# Patient Record
Sex: Female | Born: 1965 | Race: White | Hispanic: No | Marital: Married | Smoking: Current every day smoker
Health system: Southern US, Community
[De-identification: ages and names within clinical notes are randomized; demographics above are authoritative.]

## PROBLEM LIST (undated history)

## (undated) DIAGNOSIS — M542 Cervicalgia: Secondary | ICD-10-CM

## (undated) DIAGNOSIS — J449 Chronic obstructive pulmonary disease, unspecified: Secondary | ICD-10-CM

## (undated) DIAGNOSIS — G8929 Other chronic pain: Secondary | ICD-10-CM

## (undated) DIAGNOSIS — M719 Bursopathy, unspecified: Secondary | ICD-10-CM

## (undated) DIAGNOSIS — Z87442 Personal history of urinary calculi: Secondary | ICD-10-CM

## (undated) DIAGNOSIS — M549 Dorsalgia, unspecified: Secondary | ICD-10-CM

## (undated) DIAGNOSIS — M797 Fibromyalgia: Secondary | ICD-10-CM

## (undated) DIAGNOSIS — I10 Essential (primary) hypertension: Secondary | ICD-10-CM

## (undated) DIAGNOSIS — E119 Type 2 diabetes mellitus without complications: Secondary | ICD-10-CM

## (undated) HISTORY — PX: BREAST SURGERY: SHX581

## (undated) HISTORY — PX: BREAST BIOPSY: SHX20

---

## 2005-05-05 ENCOUNTER — Emergency Department (HOSPITAL_COMMUNITY): Admission: EM | Admit: 2005-05-05 | Discharge: 2005-05-06 | Payer: Self-pay | Admitting: Emergency Medicine

## 2005-05-18 ENCOUNTER — Emergency Department (HOSPITAL_COMMUNITY): Admission: EM | Admit: 2005-05-18 | Discharge: 2005-05-18 | Payer: Self-pay | Admitting: *Deleted

## 2006-01-29 ENCOUNTER — Emergency Department (HOSPITAL_COMMUNITY): Admission: EM | Admit: 2006-01-29 | Discharge: 2006-01-29 | Payer: Self-pay | Admitting: Emergency Medicine

## 2006-01-31 ENCOUNTER — Emergency Department (HOSPITAL_COMMUNITY): Admission: EM | Admit: 2006-01-31 | Discharge: 2006-01-31 | Payer: Self-pay | Admitting: Emergency Medicine

## 2006-03-19 ENCOUNTER — Ambulatory Visit: Payer: Self-pay | Admitting: Internal Medicine

## 2006-03-19 LAB — CONVERTED CEMR LAB
Sed Rate: 32 mm/hr — ABNORMAL HIGH (ref 0–25)
Vit D, 1,25-Dihydroxy: 38 (ref 20–57)

## 2006-08-27 ENCOUNTER — Ambulatory Visit: Payer: Self-pay | Admitting: Vascular Surgery

## 2006-08-27 ENCOUNTER — Emergency Department (HOSPITAL_COMMUNITY): Admission: EM | Admit: 2006-08-27 | Discharge: 2006-08-27 | Payer: Self-pay | Admitting: Emergency Medicine

## 2007-10-21 ENCOUNTER — Emergency Department (HOSPITAL_COMMUNITY): Admission: EM | Admit: 2007-10-21 | Discharge: 2007-10-21 | Payer: Self-pay | Admitting: Family Medicine

## 2008-05-05 ENCOUNTER — Emergency Department (HOSPITAL_COMMUNITY): Admission: EM | Admit: 2008-05-05 | Discharge: 2008-05-05 | Payer: Self-pay | Admitting: Emergency Medicine

## 2008-05-11 ENCOUNTER — Emergency Department (HOSPITAL_COMMUNITY): Admission: EM | Admit: 2008-05-11 | Discharge: 2008-05-11 | Payer: Self-pay | Admitting: Emergency Medicine

## 2008-05-20 ENCOUNTER — Emergency Department (HOSPITAL_COMMUNITY): Admission: EM | Admit: 2008-05-20 | Discharge: 2008-05-20 | Payer: Self-pay | Admitting: Emergency Medicine

## 2008-06-24 ENCOUNTER — Emergency Department (HOSPITAL_COMMUNITY): Admission: EM | Admit: 2008-06-24 | Discharge: 2008-06-24 | Payer: Self-pay | Admitting: Emergency Medicine

## 2008-09-21 ENCOUNTER — Emergency Department (HOSPITAL_COMMUNITY): Admission: EM | Admit: 2008-09-21 | Discharge: 2008-09-21 | Payer: Self-pay | Admitting: Emergency Medicine

## 2009-01-18 ENCOUNTER — Emergency Department (HOSPITAL_COMMUNITY): Admission: EM | Admit: 2009-01-18 | Discharge: 2009-01-18 | Payer: Self-pay | Admitting: Emergency Medicine

## 2009-05-30 ENCOUNTER — Emergency Department (HOSPITAL_COMMUNITY): Admission: EM | Admit: 2009-05-30 | Discharge: 2009-05-30 | Payer: Self-pay | Admitting: Emergency Medicine

## 2010-01-13 ENCOUNTER — Emergency Department (HOSPITAL_COMMUNITY)
Admission: EM | Admit: 2010-01-13 | Discharge: 2010-01-13 | Payer: Self-pay | Source: Home / Self Care | Admitting: Emergency Medicine

## 2010-04-23 LAB — URINE MICROSCOPIC-ADD ON

## 2010-04-23 LAB — URINALYSIS, ROUTINE W REFLEX MICROSCOPIC
Glucose, UA: NEGATIVE mg/dL
Leukocytes, UA: NEGATIVE
Nitrite: NEGATIVE
pH: 7 (ref 5.0–8.0)

## 2010-06-12 ENCOUNTER — Emergency Department (HOSPITAL_COMMUNITY)
Admission: EM | Admit: 2010-06-12 | Discharge: 2010-06-12 | Discharge status: Against Medical Advice | Payer: Self-pay | Attending: Emergency Medicine | Admitting: Emergency Medicine

## 2010-06-12 DIAGNOSIS — IMO0001 Reserved for inherently not codable concepts without codable children: Secondary | ICD-10-CM | POA: Insufficient documentation

## 2010-11-05 LAB — DIFFERENTIAL
Basophils Absolute: 0
Basophils Relative: 0
Eosinophils Absolute: 0.2
Eosinophils Relative: 3
Lymphocytes Relative: 43
Lymphs Abs: 3.5 — ABNORMAL HIGH
Monocytes Absolute: 0.6
Monocytes Relative: 7
Neutro Abs: 3.9
Neutrophils Relative %: 48

## 2010-11-05 LAB — BASIC METABOLIC PANEL
BUN: 8
CO2: 26
Calcium: 8.6
Chloride: 102
Creatinine, Ser: 0.66
GFR calc Af Amer: >60
GFR calc non Af Amer: >60
Glucose, Bld: 95
Potassium: 3.7
Sodium: 135

## 2010-11-05 LAB — URINALYSIS, ROUTINE W REFLEX MICROSCOPIC
Bilirubin Urine: NEGATIVE
Glucose, UA: NEGATIVE
Hgb urine dipstick: NEGATIVE
Ketones, ur: NEGATIVE
Nitrite: NEGATIVE
Protein, ur: NEGATIVE
Specific Gravity, Urine: 1.016
Urobilinogen, UA: 0.2
pH: 7

## 2010-11-05 LAB — CBC
HCT: 34.8 — ABNORMAL LOW
Hemoglobin: 12.2
MCHC: 34.9
MCV: 87.9
Platelets: 232
RBC: 3.96
RDW: 13.4
WBC: 8.2

## 2011-08-17 ENCOUNTER — Encounter (HOSPITAL_COMMUNITY): Payer: Self-pay | Admitting: Emergency Medicine

## 2011-08-17 ENCOUNTER — Emergency Department (HOSPITAL_COMMUNITY)
Admission: EM | Admit: 2011-08-17 | Discharge: 2011-08-17 | Disposition: A | Discharge status: Home / Self Care | Payer: Self-pay | Attending: Emergency Medicine | Admitting: Emergency Medicine

## 2011-08-17 DIAGNOSIS — M549 Dorsalgia, unspecified: Secondary | ICD-10-CM | POA: Insufficient documentation

## 2011-08-17 DIAGNOSIS — M542 Cervicalgia: Secondary | ICD-10-CM | POA: Insufficient documentation

## 2011-08-17 DIAGNOSIS — IMO0001 Reserved for inherently not codable concepts without codable children: Secondary | ICD-10-CM | POA: Insufficient documentation

## 2011-08-17 DIAGNOSIS — Z760 Encounter for issue of repeat prescription: Secondary | ICD-10-CM

## 2011-08-17 DIAGNOSIS — I10 Essential (primary) hypertension: Secondary | ICD-10-CM | POA: Insufficient documentation

## 2011-08-17 DIAGNOSIS — G8929 Other chronic pain: Secondary | ICD-10-CM | POA: Insufficient documentation

## 2011-08-17 DIAGNOSIS — Z79899 Other long term (current) drug therapy: Secondary | ICD-10-CM | POA: Insufficient documentation

## 2011-08-17 HISTORY — DX: Essential (primary) hypertension: I10

## 2011-08-17 HISTORY — DX: Fibromyalgia: M79.7

## 2011-08-17 MED ORDER — HYDROCODONE-ACETAMINOPHEN 10-500 MG PO TABS: 1.0000 | ORAL_TABLET | Freq: Four times a day (QID) | ORAL | Status: AC | PRN

## 2011-08-17 NOTE — ED Notes (Signed)
Pt states having pain all over, stiffness in muscles and joints much worse the last 3 days. Lower and cervical neck pain also worse last 3 days. Lost her job so she can't go see her regular doctor, out of her meds she usually takes for 1 week.

## 2011-08-17 NOTE — ED Provider Notes (Addendum)
History    [name] with pain "everywhere." Chronic. No acute change. Attributes to fibromyalgia. Out of her pain meds. Unemployed and can't afford co-pay for PCP so came to ED. Denies acute trauma. No fever or chills. No n/v. No SOB. No dizziness or lightheadedness.  CSN: [id number]  Arrival date & time 08/17/11  1108   First MD Initiated Contact with Patient 08/17/11 1137      Chief Complaint  Patient presents with  . Fibromyalgia  . Pain    chronic back and neck pain    (Consider location/radiation/quality/duration/timing/severity/associated sxs/prior treatment) HPI  Past Medical History  Diagnosis Date  . Hypertension   . Fibromyalgia     Past Surgical History  Procedure Date  . Breast surgery 1996    left lumpectomy    No family history on file.  History  Substance Use Topics  . Smoking status: Current Everyday Smoker -- 0.7 packs/day  . Smokeless tobacco: Never Used  . Alcohol Use: No    OB History    Grav Para Term Preterm Abortions TAB SAB Ect Mult Living                  Review of Systems   Review of symptoms negative unless otherwise noted in HPI.  Allergies  Penicillins  Home Medications   Current Outpatient Rx  Name Route Sig Dispense Refill  . HYDROCODONE-ACETAMINOPHEN 10-500 MG PO TABS Oral Take 1 tablet by mouth every 6 (six) hours as needed. For pain    . LISINOPRIL 20 MG PO TABS Oral Take 20 mg by mouth daily.      BP 108/72  Pulse 79  Temp 98.1 F (36.7 C) (Oral)  SpO2 98%  LMP 07/28/2011  Physical Exam  Nursing note and vitals reviewed. Constitutional: She is oriented to person, place, and time. She appears well-developed and well-nourished. No distress.       Sitting in chair. NAD. Obese.  HENT:  Head: Normocephalic and atraumatic.  Eyes: Conjunctivae are normal. Right eye exhibits no discharge. Left eye exhibits no discharge.  Neck: Neck supple.  Cardiovascular: Normal rate, regular rhythm and normal heart sounds.  Exam  reveals no gallop and no friction rub.   No murmur heard. Pulmonary/Chest: Effort normal and breath sounds normal. No respiratory distress.  Abdominal: Soft. She exhibits no distension. There is no tenderness.  Musculoskeletal: She exhibits no edema and no tenderness.  Neurological: She is alert and oriented to person, place, and time.  Skin: Skin is warm and dry.  Psychiatric: She has a normal mood and affect. Her behavior is normal. Thought content normal.    ED Course  Procedures (including critical care time)  Labs Reviewed - No data to display No results found.   1. Chronic pain   2. Encounter for medication refill       MDM  [name] with chronic pain which she attributes to fibromylagia. No acute change but out of pain meds. No recent ED visits. Script provided but discussed chronic pain management and med refills through ED.  Encouraged to quit smoking.        Raeford Razor, MD 08/20/11 1120  Raeford Razor, MD 08/20/11 1120

## 2011-10-14 ENCOUNTER — Emergency Department (HOSPITAL_COMMUNITY): Payer: Self-pay

## 2011-10-14 ENCOUNTER — Emergency Department (HOSPITAL_COMMUNITY)
Admission: EM | Admit: 2011-10-14 | Discharge: 2011-10-14 | Disposition: A | Discharge status: Home / Self Care | Payer: Self-pay | Attending: Emergency Medicine | Admitting: Emergency Medicine

## 2011-10-14 ENCOUNTER — Encounter (HOSPITAL_COMMUNITY): Payer: Self-pay

## 2011-10-14 DIAGNOSIS — Z880 Allergy status to penicillin: Secondary | ICD-10-CM | POA: Insufficient documentation

## 2011-10-14 DIAGNOSIS — I10 Essential (primary) hypertension: Secondary | ICD-10-CM | POA: Insufficient documentation

## 2011-10-14 DIAGNOSIS — F17200 Nicotine dependence, unspecified, uncomplicated: Secondary | ICD-10-CM | POA: Insufficient documentation

## 2011-10-14 DIAGNOSIS — R062 Wheezing: Secondary | ICD-10-CM | POA: Insufficient documentation

## 2011-10-14 DIAGNOSIS — G8929 Other chronic pain: Secondary | ICD-10-CM | POA: Insufficient documentation

## 2011-10-14 DIAGNOSIS — M549 Dorsalgia, unspecified: Secondary | ICD-10-CM | POA: Insufficient documentation

## 2011-10-14 HISTORY — DX: Dorsalgia, unspecified: M54.9

## 2011-10-14 HISTORY — DX: Other chronic pain: G89.29

## 2011-10-14 HISTORY — DX: Cervicalgia: M54.2

## 2011-10-14 MED ORDER — ALBUTEROL SULFATE (5 MG/ML) 0.5% IN NEBU
5.0000 mg | INHALATION_SOLUTION | Freq: Once | RESPIRATORY_TRACT | Status: AC
  Administered 2011-10-14: 5 mg via RESPIRATORY_TRACT
  Filled 2011-10-14: qty 1

## 2011-10-14 MED ORDER — PREDNISONE 10 MG PO TABS: 20.0000 mg | ORAL_TABLET | Freq: Every day | ORAL | Status: DC

## 2011-10-14 MED ORDER — PREDNISONE 20 MG PO TABS
60.0000 mg | ORAL_TABLET | Freq: Once | ORAL | Status: AC
  Administered 2011-10-14: 60 mg via ORAL
  Filled 2011-10-14: qty 3

## 2011-10-14 MED ORDER — CYCLOBENZAPRINE HCL 10 MG PO TABS: 10.0000 mg | ORAL_TABLET | Freq: Two times a day (BID) | ORAL | Status: DC | PRN

## 2011-10-14 MED ORDER — ALBUTEROL SULFATE HFA 108 (90 BASE) MCG/ACT IN AERS
2.0000 | INHALATION_SPRAY | RESPIRATORY_TRACT | Status: DC | PRN
  Administered 2011-10-14: 2 via RESPIRATORY_TRACT
  Filled 2011-10-14: qty 6.7

## 2011-10-14 NOTE — ED Notes (Signed)
Pt c/o sob and chronic back pain. SOB started this morning with little cough, sat 96%, in no distress. Back pain x couple days 8/10, haven't been taking pain medicines.

## 2011-10-14 NOTE — ED Provider Notes (Signed)
History     CSN: [id number]  Arrival date & time 10/14/11  1614   First MD Initiated Contact with Patient 10/14/11 2025      Chief Complaint  Patient presents with  . Shortness of Breath  . Back Pain    (Consider location/radiation/quality/duration/timing/severity/associated sxs/prior treatment) HPI Patient here with back pain today.  Patient with history of chronic back pain and now without pmd as lost insurance and no pmd for three months.  Patient with sob since this a.m. On awakening.  Sob present on awakening, no cough, no fever, some wheezing, patient is smoker.  Ibuprofen for back pain, patient was on hydrocodone three months ago.  States had some hydrocodone two months ago for fibromyalgia.  NO dvt or pe, no trips, or immobilization,  Past Medical History  Diagnosis Date  . Hypertension   . Fibromyalgia   . Chronic back pain   . Chronic neck pain     Past Surgical History  Procedure Date  . Breast surgery     No family history on file.  History  Substance Use Topics  . Smoking status: Current Every Day Smoker -- 0.7 packs/day  . Smokeless tobacco: Never Used  . Alcohol Use: No    OB History    Grav Para Term Preterm Abortions TAB SAB Ect Mult Living                  Review of Systems  Allergies  Penicillins  Home Medications   Current Outpatient Rx  Name Route Sig Dispense Refill  . HYDROCODONE-ACETAMINOPHEN 10-500 MG PO TABS Oral Take 1 tablet by mouth every 6 (six) hours as needed. For pain    . IBUPROFEN 200 MG PO TABS Oral Take 800 mg by mouth 2 (two) times daily as needed. For pain.    Marland Kitchen LISINOPRIL 20 MG PO TABS Oral Take 20 mg by mouth daily.      BP 142/93  Pulse 101  Temp 97.9 F (36.6 C) (Oral)  Resp 16  SpO2 96%  LMP 10/07/2011  Physical Exam  Nursing note and vitals reviewed. Constitutional: She is oriented to person, place, and time. She appears well-developed and well-nourished.  HENT:  Head: Normocephalic and atraumatic.    Right Ear: External ear normal.  Left Ear: External ear normal.  Mouth/Throat: Oropharynx is clear and moist.  Eyes: Conjunctivae normal are normal. Pupils are equal, round, and reactive to light.  Neck: Normal range of motion. Neck supple.  Cardiovascular: Normal rate, regular rhythm and normal heart sounds.   Pulmonary/Chest: Effort normal. She has wheezes.       Expiratory wheezes  Abdominal: Soft. Bowel sounds are normal.  Musculoskeletal: Normal range of motion.  Neurological: She is alert and oriented to person, place, and time. She has normal strength and normal reflexes. A sensory deficit is present. She displays a negative Romberg sign. GCS eye subscore is 4. GCS verbal subscore is 5. GCS motor subscore is 6.    ED Course  Procedures (including critical care time)  Labs Reviewed - No data to display Dg Chest 2 View  10/14/2011  *RADIOLOGY REPORT*  Clinical Data: 46 year old female shortness of breath and back pain.  CHEST - 2 VIEW  Comparison: 05/30/2009.  Findings: Slightly larger lung volumes.  Cardiac size and mediastinal contours are within normal limits.  Visualized tracheal air column is within normal limits.  Chronic scarring in the left midlung is stable.  No pneumothorax, pleural effusion, pulmonary edema or acute  pulmonary opacity. Stable visualized osseous structures.  IMPRESSION: No acute cardiopulmonary abnormality.   Original Report Authenticated By: Harley Hallmark, M.D.      No diagnosis found.    MDM  Patient with some expiratory wheezes but no respiratory distress.  Sats normal.  Patient counseled regarding pain treatment and smoking.  Plan mdi and prednisone. Patient to be treated with flexeril and will return to nsaids after prednisone.         Hilario Quarry, MD 10/14/11 [phone]

## 2011-11-05 ENCOUNTER — Emergency Department (INDEPENDENT_AMBULATORY_CARE_PROVIDER_SITE_OTHER)
Admission: EM | Admit: 2011-11-05 | Discharge: 2011-11-05 | Disposition: A | Discharge status: Home / Self Care | Payer: Self-pay | Source: Home / Self Care | Attending: Emergency Medicine | Admitting: Emergency Medicine

## 2011-11-05 ENCOUNTER — Encounter (HOSPITAL_COMMUNITY): Payer: Self-pay | Admitting: *Deleted

## 2011-11-05 DIAGNOSIS — G8929 Other chronic pain: Secondary | ICD-10-CM

## 2011-11-05 DIAGNOSIS — M797 Fibromyalgia: Secondary | ICD-10-CM

## 2011-11-05 DIAGNOSIS — IMO0001 Reserved for inherently not codable concepts without codable children: Secondary | ICD-10-CM

## 2011-11-05 MED ORDER — TRAMADOL HCL 50 MG PO TABS: 50.0000 mg | ORAL_TABLET | Freq: Four times a day (QID) | ORAL | Status: DC | PRN

## 2011-11-05 MED ORDER — KETOROLAC TROMETHAMINE 60 MG/2ML IM SOLN
60.0000 mg | Freq: Once | INTRAMUSCULAR | Status: AC
  Administered 2011-11-05: 60 mg via INTRAMUSCULAR

## 2011-11-05 MED ORDER — KETOROLAC TROMETHAMINE 60 MG/2ML IM SOLN
INTRAMUSCULAR | Status: AC
  Filled 2011-11-05: qty 2

## 2011-11-05 NOTE — ED Notes (Signed)
C/o pain all over from Fibromyalgia. States it flared up 2 days ago.

## 2011-11-05 NOTE — ED Provider Notes (Signed)
Medical screening examination/treatment/procedure(s) were performed by a resident physician and as supervising physician I was immediately available for consultation/collaboration.  Additionally, I saw the patient independently, verified the history, examined the patient and discussed the treatment plan with the resident. The patient presents tonight requesting refills and hydrocodone. We explained to her that we did not treat chronic pain with opioid analgesics. She was given a few tramadols to tide her over until she can find a primary care physician as well as an injection of Toradol.  Leslee Home, M.D.    Reuben Likes, MD 11/05/11 2216

## 2011-11-05 NOTE — ED Provider Notes (Signed)
History     CSN: [id number]  Arrival date & time 11/05/11  1709   First MD Initiated Contact with Patient 11/05/11 1801      No chief complaint on file.   (Consider location/radiation/quality/duration/timing/severity/associated sxs/prior treatment) HPI pt with long history of fibromyalgia reportedly managed with Lortab.  Reports she lost job and lost insurance 4 months ago and since then has not been able to afford to go to the doctor.  She has had increasing amounts of generalized body pains that are typical of her fibromyalgia and has run out of her pain medications.  She has been taking large quantities of NSAIDs, which are only mildly helpful, and says they are causing her to have an upset stomach.  There is no immediate inciting event.  She admits to some wheezing but no shortness of breath, no skin lesions, no weakness or numbness.  Past Medical History  Diagnosis Date  . Hypertension   . Fibromyalgia   . Chronic back pain   . Chronic neck pain     Past Surgical History  Procedure Date  . Breast surgery     No family history on file.  History  Substance Use Topics  . Smoking status: Current Every Day Smoker -- 0.7 packs/day  . Smokeless tobacco: Never Used  . Alcohol Use: No    OB History    Grav Para Term Preterm Abortions TAB SAB Ect Mult Living                  Review of Systems per HPI.  Allergies  Penicillins  Home Medications   Current Outpatient Rx  Name Route Sig Dispense Refill  . CYCLOBENZAPRINE HCL 10 MG PO TABS Oral Take 1 tablet (10 mg total) by mouth 2 (two) times daily as needed for muscle spasms. 20 tablet 0  . HYDROCODONE-ACETAMINOPHEN 10-500 MG PO TABS Oral Take 1 tablet by mouth every 6 (six) hours as needed. For pain    . IBUPROFEN 200 MG PO TABS Oral Take 800 mg by mouth 2 (two) times daily as needed. For pain.    Marland Kitchen LISINOPRIL 20 MG PO TABS Oral Take 20 mg by mouth daily.    Marland Kitchen PREDNISONE 10 MG PO TABS Oral Take 2 tablets (20 mg  total) by mouth daily. 15 tablet 0  . TRAMADOL HCL 50 MG PO TABS Oral Take 1 tablet (50 mg total) by mouth every 6 (six) hours as needed for pain. 30 tablet 0    BP 146/73  Pulse 75  Temp 98.4 F (36.9 C) (Oral)  Resp 18  SpO2 99%  LMP 10/07/2011  Physical Exam  Constitutional: She is oriented to person, place, and time. She appears well-developed and well-nourished. No distress.  HENT:  Head: Normocephalic and atraumatic.  Right Ear: External ear normal.  Left Ear: External ear normal.  Neck: Normal range of motion.  Cardiovascular: Normal rate, regular rhythm and normal heart sounds.   Pulmonary/Chest: Effort normal.       Occasional scattered wheezes with good air movement  Musculoskeletal: Normal range of motion. She exhibits no edema.  Neurological: She is alert and oriented to person, place, and time.  Psychiatric: She has a normal mood and affect.    ED Course  Procedures (including critical care time)  Labs Reviewed - No data to display No results found.   1. Fibromyalgia   2. Chronic pain       MDM  Chronic pain syndrome.  Gave toradol injection  and rx for #30 tramadol.  Rec seeing [facility] financial assistance for [employer].        Brent Bulla, MD 11/05/11 [phone]

## 2012-02-22 ENCOUNTER — Emergency Department (HOSPITAL_COMMUNITY)
Admission: EM | Admit: 2012-02-22 | Discharge: 2012-02-22 | Disposition: A | Discharge status: Home / Self Care | Payer: Self-pay | Attending: Emergency Medicine | Admitting: Emergency Medicine

## 2012-02-22 ENCOUNTER — Encounter (HOSPITAL_COMMUNITY): Payer: Self-pay | Admitting: Emergency Medicine

## 2012-02-22 DIAGNOSIS — M5410 Radiculopathy, site unspecified: Secondary | ICD-10-CM

## 2012-02-22 DIAGNOSIS — I10 Essential (primary) hypertension: Secondary | ICD-10-CM | POA: Insufficient documentation

## 2012-02-22 DIAGNOSIS — Z791 Long term (current) use of non-steroidal anti-inflammatories (NSAID): Secondary | ICD-10-CM | POA: Insufficient documentation

## 2012-02-22 DIAGNOSIS — IMO0002 Reserved for concepts with insufficient information to code with codable children: Secondary | ICD-10-CM | POA: Insufficient documentation

## 2012-02-22 DIAGNOSIS — F17200 Nicotine dependence, unspecified, uncomplicated: Secondary | ICD-10-CM | POA: Insufficient documentation

## 2012-02-22 DIAGNOSIS — E669 Obesity, unspecified: Secondary | ICD-10-CM | POA: Insufficient documentation

## 2012-02-22 DIAGNOSIS — G8929 Other chronic pain: Secondary | ICD-10-CM | POA: Insufficient documentation

## 2012-02-22 DIAGNOSIS — Z79899 Other long term (current) drug therapy: Secondary | ICD-10-CM | POA: Insufficient documentation

## 2012-02-22 MED ORDER — TRAMADOL HCL 50 MG PO TABS: 50.0000 mg | ORAL_TABLET | Freq: Four times a day (QID) | ORAL | Status: DC | PRN

## 2012-02-22 MED ORDER — TRAMADOL HCL 50 MG PO TABS
50.0000 mg | ORAL_TABLET | Freq: Once | ORAL | Status: AC
  Administered 2012-02-22: 50 mg via ORAL
  Filled 2012-02-22: qty 1

## 2012-02-22 MED ORDER — METHOCARBAMOL 500 MG PO TABS: 500.0000 mg | ORAL_TABLET | Freq: Two times a day (BID) | ORAL | Status: DC

## 2012-02-22 NOTE — ED Notes (Signed)
Pt to room from waiting area

## 2012-02-22 NOTE — ED Notes (Signed)
Pt complains of lower back pain x 1 day

## 2012-02-22 NOTE — ED Provider Notes (Signed)
History     CSN: [id number]  Arrival date & time 02/22/12  1406   None     Chief Complaint  Patient presents with  . Back Pain    (Consider location/radiation/quality/duration/timing/severity/associated sxs/prior treatment) HPI  47 year old female with history of fibromyalgia, chronic neck pain, and chronic back pain presents complaining of low back pain. Patient reports she woke up this morning with sharp shooting pain which started from her low back and radiates to the sides of her left leg. Pain radiates towards her knee. Pain is worsened with laying flat and improves when she sits forward. Pain feels very similar to prior back pain that she had once every 3-4 months. States she has a history of pinched nerve. She denies fever, chills, abdominal pain, urinary all bowel incontinence, saddle paresthesia, or rash. She denies any recent trauma. She denies any numbness or weakness.  Sts she usually see her PCP for pain control but no longer has insurance.  Pt has not tried anything to alleviate her sxs.    Past Medical History  Diagnosis Date  . Hypertension   . Fibromyalgia   . Chronic back pain   . Chronic neck pain     Past Surgical History  Procedure Date  . Breast surgery     biopsy benign tumor    Family History  Problem Relation Age of Onset  . Gout Father   . Hypertension Father     History  Substance Use Topics  . Smoking status: Current Every Day Smoker -- 0.7 packs/day    Types: Cigarettes  . Smokeless tobacco: Never Used  . Alcohol Use: No    OB History    Grav Para Term Preterm Abortions TAB SAB Ect Mult Living                  Review of Systems  Constitutional:       10 Systems reviewed and all are negative for acute change except as noted in the HPI.     Allergies  Penicillins  Home Medications   Current Outpatient Rx  Name  Route  Sig  Dispense  Refill  . IBUPROFEN 200 MG PO TABS   Oral   Take 800 mg by mouth 2 (two) times daily as  needed. For pain.         Marland Kitchen LISINOPRIL 20 MG PO TABS   Oral   Take 20 mg by mouth daily.           BP 139/109  Pulse 73  Temp 97.8 F (36.6 C) (Oral)  Resp 20  SpO2 96%  Physical Exam  Nursing note and vitals reviewed. Constitutional: She is oriented to person, place, and time. She appears well-developed and well-nourished.       Moderately obese  HENT:  Head: Atraumatic.  Eyes: Conjunctivae normal are normal.  Neck: Neck supple.  Abdominal: Soft. There is no tenderness.  Musculoskeletal: She exhibits tenderness (tenderness to L lower lumbar region without midline spine tenderness, step off or crepitus . Increasing back pain, with Straigh leg raise.  Patella 2+ bilat, no foot drop.  no overlying skin changes). She exhibits no edema.  Neurological: She is alert and oriented to person, place, and time.  Skin: Skin is warm. No rash noted.  Psychiatric: She has a normal mood and affect.    ED Course  Procedures (including critical care time)  Labs Reviewed - No data to display No results found.   No diagnosis found.  3:35 PM Acute on chronic low back pain, hx of sciatica and fibromyalgias.  No red flags.  Able to ambulate.  RICE therapy discussed.  Will d/c with tramadol and muscle relaxant.  Ortho referral as needed.  No traumatic injury warranted xray at this time.    BP 139/109  Pulse 73  Temp 97.8 F (36.6 C) (Oral)  Resp 20  SpO2 96%  1. Radicular back pain  MDM  I have reviewed nursing notes and vital signs.  I reviewed available ER/hospitalization records thought the EMR         Fayrene Helper, [place] 02/22/12 1536

## 2012-02-23 NOTE — ED Provider Notes (Signed)
Medical screening examination/treatment/procedure(s) were performed by non-physician practitioner and as supervising physician I was immediately available for consultation/collaboration.  Myriam Brandhorst T Seona Clemenson, MD 02/23/12 1456 

## 2012-08-04 ENCOUNTER — Other Ambulatory Visit (HOSPITAL_COMMUNITY): Payer: Self-pay | Admitting: *Deleted

## 2012-08-04 DIAGNOSIS — N63 Unspecified lump in unspecified breast: Secondary | ICD-10-CM

## 2012-08-26 ENCOUNTER — Ambulatory Visit (HOSPITAL_COMMUNITY)
Admission: RE | Admit: 2012-08-26 | Discharge: 2012-08-26 | Disposition: A | Discharge status: Home / Self Care | Payer: PRIVATE HEALTH INSURANCE | Source: Ambulatory Visit | Attending: *Deleted | Admitting: *Deleted

## 2012-08-26 ENCOUNTER — Other Ambulatory Visit (HOSPITAL_COMMUNITY): Payer: Self-pay | Admitting: *Deleted

## 2012-08-26 DIAGNOSIS — N63 Unspecified lump in unspecified breast: Secondary | ICD-10-CM | POA: Insufficient documentation

## 2013-08-10 ENCOUNTER — Ambulatory Visit: Payer: Self-pay

## 2013-08-13 ENCOUNTER — Ambulatory Visit (INDEPENDENT_AMBULATORY_CARE_PROVIDER_SITE_OTHER): Payer: Self-pay | Admitting: Family Medicine

## 2013-08-13 VITALS — BP 134/88 | HR 84 | Temp 97.8°F | Resp 18 | Ht 69.0 in | Wt 270.4 lb

## 2013-08-13 DIAGNOSIS — R062 Wheezing: Secondary | ICD-10-CM

## 2013-08-13 DIAGNOSIS — G4700 Insomnia, unspecified: Secondary | ICD-10-CM

## 2013-08-13 DIAGNOSIS — Z720 Tobacco use: Secondary | ICD-10-CM

## 2013-08-13 DIAGNOSIS — F17200 Nicotine dependence, unspecified, uncomplicated: Secondary | ICD-10-CM

## 2013-08-13 DIAGNOSIS — M797 Fibromyalgia: Secondary | ICD-10-CM

## 2013-08-13 DIAGNOSIS — IMO0001 Reserved for inherently not codable concepts without codable children: Secondary | ICD-10-CM

## 2013-08-13 MED ORDER — TRAMADOL HCL 50 MG PO TABS: 50.0000 mg | ORAL_TABLET | Freq: Four times a day (QID) | ORAL | Status: DC | PRN

## 2013-08-13 MED ORDER — ALBUTEROL SULFATE HFA 108 (90 BASE) MCG/ACT IN AERS
2.0000 | INHALATION_SPRAY | RESPIRATORY_TRACT | Status: AC | PRN
Start: 2013-08-13 — End: ?

## 2013-08-13 NOTE — Progress Notes (Signed)
Subjective:    Patient ID: Alexandra Velasquez, female    DOB: 1965/09/20, 48 y.o.   MRN: [id number]  HPI Patient has not been here in many years. She presents today with 1 week history of increased fibromyalgia symptoms. Having increased pain all over- neck, shoulder, back. Has an appointment with a PA at [facility] next week to establish care and is seeking medication to help her get through until her appointment. She has long history of fibromyalgia. She takes flexeril at night. She find tramadol helps some. Oxycodone helps, but for only very brief time. She has been on amitriptyline in the past which made her feel "weird." She is not sure of all the other medications she has tried to treat her fibromyalgia. She has recently tried to increase her exercise but has had increased foot pain due to her bone spurs.    She has slept poorly for many years, having difficulty falling and staying asleep. Always feels fatigued, which exacerbates her pain.   Past Medical History  Diagnosis Date  . Hypertension   . Fibromyalgia   . Chronic back pain   . Chronic neck pain    Past Surgical History  Procedure Laterality Date  . Breast surgery      biopsy benign tumor   History   Social History  . Marital Status: Married    Spouse Name: N/A    Number of Children: N/A  . Years of Education: N/A   Occupational History  . Not on file.   Social History Main Topics  . Smoking status: Current Every Day Smoker -- 0.75 packs/day    Types: Cigarettes  . Smokeless tobacco: Never Used  . Alcohol Use: No  . Drug Use: No  . Sexual Activity: Yes    Birth Control/ Protection: None   Other Topics Concern  . Not on file   Social History Narrative  . No narrative on file      Review of Systems +wheezeing, no fever/chills, periods very irregular,     Objective:   Physical Exam  Vitals reviewed. Constitutional: She is oriented to person, place, and time. She appears  well-developed and well-nourished. No distress.  HENT:  Head: Normocephalic and atraumatic.  Eyes: Conjunctivae are normal.  Neck: Normal range of motion. Neck supple.  Cardiovascular: Normal rate, regular rhythm and normal heart sounds.   Pulmonary/Chest: Effort normal. No respiratory distress. She has wheezes (fine expiratory wheezes upper posterior airways). She has no rales. She exhibits no tenderness.  Musculoskeletal: Normal range of motion.  Neurological: She is alert and oriented to person, place, and time.  Skin: Skin is warm and dry. She is not diaphoretic.  Psychiatric: She has a normal mood and affect. Her behavior is normal. Judgment and thought content normal.      Assessment & Plan:  1. Wheezing - albuterol (PROVENTIL HFA;VENTOLIN HFA) 108 (90 BASE) MCG/ACT inhaler; Inhale 2 puffs into the lungs every 4 (four) hours as needed for wheezing or shortness of breath (cough, shortness of breath or wheezing.).  Dispense: 1 Inhaler; Refill: 1  2. Fibromyalgia - traMADol (ULTRAM) 50 MG tablet; Take 1 tablet (50 mg total) by mouth every 6 (six) hours as needed for moderate pain.  Dispense: 30 tablet; Refill: 0 -discussed creating a long term plan for her fibromyalgia management with her new PCP as well as importance of regular exercise, improved sleep, smoking cessation and stress management  3. Insomnia -provided written and verbal instructions regarding improved  sleep hygiene.   Emi Belfasteborah B. Rhylee Nunn, FNP-BC  [facility]  08/15/2013 9:25 AM

## 2013-08-13 NOTE — Patient Instructions (Signed)
Insomnia Insomnia is frequent trouble falling and/or staying asleep. Insomnia can be a long term problem or a short term problem. Both are common. Insomnia can be a short term problem when the wakefulness is related to a certain stress or worry. Long term insomnia is often related to ongoing stress during waking hours and/or poor sleeping habits. Overtime, sleep deprivation itself can make the problem worse. Every little thing feels more severe because you are overtired and your ability to cope is decreased. CAUSES   Stress, anxiety, and depression.  Poor sleeping habits.  Distractions such as TV in the bedroom.  Naps close to bedtime.  Engaging in emotionally charged conversations before bed.  Technical reading before sleep.  Alcohol and other sedatives. They may make the problem worse. They can hurt normal sleep patterns and normal dream activity.  Stimulants such as caffeine for several hours prior to bedtime.  Pain syndromes and shortness of breath can cause insomnia.  Exercise late at night.  Changing time zones may cause sleeping problems (jet lag). It is sometimes helpful to have someone observe your sleeping patterns. They should look for periods of not breathing during the night (sleep apnea). They should also look to see how long those periods last. If you live alone or observers are uncertain, you can also be observed at a sleep clinic where your sleep patterns will be professionally monitored. Sleep apnea requires a checkup and treatment. Give your caregivers your medical history. Give your caregivers observations your family has made about your sleep.  SYMPTOMS   Not feeling rested in the morning.  Anxiety and restlessness at bedtime.  Difficulty falling and staying asleep. TREATMENT   Your caregiver may prescribe treatment for an underlying medical disorders. Your caregiver can give advice or help if you are using alcohol or other drugs for self-medication. Treatment  of underlying problems will usually eliminate insomnia problems.  Medications can be prescribed for short time use. They are generally not recommended for lengthy use.  Over-the-counter sleep medicines are not recommended for lengthy use. They can be habit forming.  You can promote easier sleeping by making lifestyle changes such as:  Using relaxation techniques that help with breathing and reduce muscle tension.  Exercising earlier in the day.  Changing your diet and the time of your last meal. No night time snacks.  Establish a regular time to go to bed.  Counseling can help with stressful problems and worry.  Soothing music and white noise may be helpful if there are background noises you cannot remove.  Stop tedious detailed work at least one hour before bedtime. HOME CARE INSTRUCTIONS   Keep a diary. Inform your caregiver about your progress. This includes any medication side effects. See your caregiver regularly. Take note of:  Times when you are asleep.  Times when you are awake during the night.  The quality of your sleep.  How you feel the next day. This information will help your caregiver care for you.  Get out of bed if you are still awake after 15 minutes. Read or do some quiet activity. Keep the lights down. Wait until you feel sleepy and go back to bed.  Keep regular sleeping and waking hours. Avoid naps.  Exercise regularly.  Avoid distractions at bedtime. Distractions include watching television or engaging in any intense or detailed activity like attempting to balance the household checkbook.  Develop a bedtime ritual. Keep a familiar routine of bathing, brushing your teeth, climbing into bed at the same   time each night, listening to soothing music. Routines increase the success of falling to sleep faster.  Use relaxation techniques. This can be using breathing and muscle tension release routines. It can also include visualizing peaceful scenes. You can  also help control troubling or intruding thoughts by keeping your mind occupied with boring or repetitive thoughts like the old concept of counting sheep. You can make it more creative like imagining planting one beautiful flower after another in your backyard garden.  During your day, work to eliminate stress. When this is not possible use some of the previous suggestions to help reduce the anxiety that accompanies stressful situations. MAKE SURE YOU:   Understand these instructions.  Will watch your condition.  Will get help right away if you are not doing well or get worse. Document Released: 01/05/2000 Document Revised: 04/01/2011 Document Reviewed: 02/04/2007 [facility]ExitCare Patient Information 2015 [employer], [place]. This information is not intended to replace advice given to you by your health care provider. Make sure you discuss any questions you have with your health care provider.   Fibromyalgia Fibromyalgia is a disorder that is often misunderstood. It is associated with muscular pains and tenderness that comes and goes. It is often associated with fatigue and sleep disturbances. Though it tends to be long-lasting, fibromyalgia is not life-threatening. CAUSES  The exact cause of fibromyalgia is unknown. People with certain gene types are predisposed to developing fibromyalgia and other conditions. Certain factors can play a role as triggers, such as:  Spine disorders.  Arthritis.  Severe injury (trauma) and other physical stressors.  Emotional stressors. SYMPTOMS   The main symptom is pain and stiffness in the muscles and joints, which can vary over time.  Sleep and fatigue problems. Other related symptoms may include:  Bowel and bladder problems.  Headaches.  Visual problems.  Problems with odors and noises.  Depression or mood changes.  Painful periods (dysmenorrhea).  Dryness of the skin or eyes. DIAGNOSIS  There are no specific tests for diagnosing fibromyalgia.  Patients can be diagnosed accurately from the specific symptoms they have. The diagnosis is made by determining that nothing else is causing the problems. TREATMENT  There is no cure. Management includes medicines and an active, healthy lifestyle. The goal is to enhance physical fitness, decrease pain, and improve sleep. HOME CARE INSTRUCTIONS   Only take over-the-counter or prescription medicines as directed by your caregiver. Sleeping pills, tranquilizers, and pain medicines may make your problems worse.  Low-impact aerobic exercise is very important and advised for treatment. At first, it may seem to make pain worse. Gradually increasing your tolerance will overcome this feeling.  Learning relaxation techniques and how to control stress will help you. Biofeedback, visual imagery, hypnosis, muscle relaxation, yoga, and meditation are all options.  Anti-inflammatory medicines and physical therapy may provide short-term help.  Acupuncture or massage treatments may help.  Take muscle relaxant medicines as suggested by your caregiver.  Avoid stressful situations.  Plan a healthy lifestyle. This includes your diet, sleep, rest, exercise, and friends.  Find and practice a hobby you enjoy.  Join a fibromyalgia support group for interaction, ideas, and sharing advice. This may be helpful. SEEK MEDICAL CARE IF:  You are not having good results or improvement from your treatment. FOR MORE INFORMATION  National Fibromyalgia Association: www.fmaware.org Arthritis Foundation: www.arthritis.org Document Released: 01/07/2005 Document Revised: 04/01/2011 Document Reviewed: 04/19/2009 [facility] Patient Information 2015 [place], [place]. This information is not intended to replace advice given to you by your health care  provider. Make sure you discuss any questions you have with your health care provider.

## 2013-08-15 DIAGNOSIS — M797 Fibromyalgia: Secondary | ICD-10-CM | POA: Insufficient documentation

## 2013-08-15 DIAGNOSIS — G4700 Insomnia, unspecified: Secondary | ICD-10-CM | POA: Insufficient documentation

## 2013-08-15 DIAGNOSIS — Z720 Tobacco use: Secondary | ICD-10-CM | POA: Insufficient documentation

## 2013-08-23 ENCOUNTER — Telehealth: Payer: Self-pay | Admitting: *Deleted

## 2013-08-23 NOTE — Telephone Encounter (Signed)
Left message on machine for patient explaining that Mr Pricilla Holmucker PA does not prescribe chronic pain medication.  He will treat her for other concerns and can refer her to a pain clinic if needed.

## 2013-08-26 ENCOUNTER — Ambulatory Visit: Payer: PRIVATE HEALTH INSURANCE | Admitting: Physician Assistant

## 2013-08-26 NOTE — Telephone Encounter (Signed)
Pt states she did not say anything about wanting pain medications when she made appt and does not appreciate the message she received. 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 Apologized to patient and asked her to reconsidered, but pt had made up her mind.  Pt asked to cancel appt and hung up.

## 2013-10-12 DIAGNOSIS — Z0271 Encounter for disability determination: Secondary | ICD-10-CM

## 2014-06-06 ENCOUNTER — Emergency Department (HOSPITAL_COMMUNITY)
Admission: EM | Admit: 2014-06-06 | Discharge: 2014-06-06 | Disposition: A | Discharge status: Home / Self Care | Payer: PRIVATE HEALTH INSURANCE | Attending: Emergency Medicine | Admitting: Emergency Medicine

## 2014-06-06 ENCOUNTER — Emergency Department (HOSPITAL_COMMUNITY): Payer: PRIVATE HEALTH INSURANCE

## 2014-06-06 ENCOUNTER — Encounter (HOSPITAL_COMMUNITY): Payer: Self-pay | Admitting: Nurse Practitioner

## 2014-06-06 DIAGNOSIS — Z79899 Other long term (current) drug therapy: Secondary | ICD-10-CM | POA: Insufficient documentation

## 2014-06-06 DIAGNOSIS — R1011 Right upper quadrant pain: Secondary | ICD-10-CM | POA: Insufficient documentation

## 2014-06-06 DIAGNOSIS — Z720 Tobacco use: Secondary | ICD-10-CM | POA: Insufficient documentation

## 2014-06-06 DIAGNOSIS — I10 Essential (primary) hypertension: Secondary | ICD-10-CM | POA: Insufficient documentation

## 2014-06-06 DIAGNOSIS — G8929 Other chronic pain: Secondary | ICD-10-CM | POA: Insufficient documentation

## 2014-06-06 DIAGNOSIS — R079 Chest pain, unspecified: Secondary | ICD-10-CM | POA: Insufficient documentation

## 2014-06-06 DIAGNOSIS — J441 Chronic obstructive pulmonary disease with (acute) exacerbation: Secondary | ICD-10-CM

## 2014-06-06 DIAGNOSIS — E669 Obesity, unspecified: Secondary | ICD-10-CM | POA: Insufficient documentation

## 2014-06-06 DIAGNOSIS — Z880 Allergy status to penicillin: Secondary | ICD-10-CM | POA: Insufficient documentation

## 2014-06-06 DIAGNOSIS — M797 Fibromyalgia: Secondary | ICD-10-CM | POA: Insufficient documentation

## 2014-06-06 HISTORY — DX: Chronic obstructive pulmonary disease, unspecified: J44.9

## 2014-06-06 HISTORY — DX: Bursopathy, unspecified: M71.9

## 2014-06-06 LAB — CBC
HCT: 43.8 % (ref 36.0–46.0)
HEMOGLOBIN: 14.3 g/dL (ref 12.0–15.0)
MCH: 28.9 pg (ref 26.0–34.0)
MCHC: 32.6 g/dL (ref 30.0–36.0)
MCV: 88.7 fL (ref 78.0–100.0)
Platelets: 306 10*3/uL (ref 150–400)
RBC: 4.94 MIL/uL (ref 3.87–5.11)
RDW: 14.8 % (ref 11.5–15.5)
WBC: 10.1 10*3/uL (ref 4.0–10.5)

## 2014-06-06 LAB — HEPATIC FUNCTION PANEL
ALBUMIN: 3.7 g/dL (ref 3.5–5.0)
ALT: 20 U/L (ref 14–54)
AST: 24 U/L (ref 15–41)
Alkaline Phosphatase: 37 U/L — ABNORMAL LOW (ref 38–126)
Bilirubin, Direct: 0.1 mg/dL (ref 0.1–0.5)
Indirect Bilirubin: 0.2 mg/dL — ABNORMAL LOW (ref 0.3–0.9)
Total Bilirubin: 0.3 mg/dL (ref 0.3–1.2)
Total Protein: 6.7 g/dL (ref 6.5–8.1)

## 2014-06-06 LAB — BASIC METABOLIC PANEL
Anion gap: 12 (ref 5–15)
BUN: 12 mg/dL (ref 6–20)
CALCIUM: 9.1 mg/dL (ref 8.9–10.3)
CO2: 21 mmol/L — ABNORMAL LOW (ref 22–32)
CREATININE: 0.84 mg/dL (ref 0.44–1.00)
Chloride: 106 mmol/L (ref 101–111)
GLUCOSE: 110 mg/dL — AB (ref 65–99)
POTASSIUM: 4.4 mmol/L (ref 3.5–5.1)
Sodium: 139 mmol/L (ref 135–145)

## 2014-06-06 LAB — I-STAT TROPONIN, ED: Troponin i, poc: 0.02 ng/mL (ref 0.00–0.08)

## 2014-06-06 LAB — BRAIN NATRIURETIC PEPTIDE: B NATRIURETIC PEPTIDE 5: 97.8 pg/mL (ref 0.0–100.0)

## 2014-06-06 LAB — CK: Total CK: 43 U/L (ref 38–234)

## 2014-06-06 LAB — LIPASE, BLOOD: Lipase: 21 U/L — ABNORMAL LOW (ref 22–51)

## 2014-06-06 MED ORDER — GABAPENTIN 100 MG PO CAPS: 100.0000 mg | ORAL_CAPSULE | Freq: Three times a day (TID) | ORAL | Status: AC

## 2014-06-06 MED ORDER — IPRATROPIUM-ALBUTEROL 0.5-2.5 (3) MG/3ML IN SOLN
3.0000 mL | Freq: Once | RESPIRATORY_TRACT | Status: AC
  Administered 2014-06-06: 3 mL via RESPIRATORY_TRACT
  Filled 2014-06-06: qty 3

## 2014-06-06 MED ORDER — ACETAMINOPHEN 500 MG PO TABS
1000.0000 mg | ORAL_TABLET | Freq: Once | ORAL | Status: AC
  Administered 2014-06-06: 1000 mg via ORAL
  Filled 2014-06-06: qty 2

## 2014-06-06 MED ORDER — LISINOPRIL 20 MG PO TABS: 20.0000 mg | ORAL_TABLET | Freq: Every day | ORAL | Status: DC

## 2014-06-06 MED ORDER — ALBUTEROL SULFATE HFA 108 (90 BASE) MCG/ACT IN AERS
2.0000 | INHALATION_SPRAY | Freq: Once | RESPIRATORY_TRACT | Status: AC
  Administered 2014-06-06: 2 via RESPIRATORY_TRACT
  Filled 2014-06-06: qty 6.7

## 2014-06-06 NOTE — ED Provider Notes (Signed)
CSN: [id number]     Arrival date & time 06/06/14  1205 History   First MD Initiated Contact with Patient 06/06/14 1231     Chief Complaint  Patient presents with  . Shortness of Breath     (Consider location/radiation/quality/duration/timing/severity/associated sxs/prior Treatment) HPI  49 year old female with a history of chronic pain, fibromyalgia, and COPD presents with progressive dyspnea over the past 1 month. Also having arm and leg pain during that same time. Patient states the pain is progressive and more continuous. It is mostly upper arms on the medial aspects as well as thighs. She has also noticed some cramping in her right upper quadrant. This seems to come and go with the other pains and does not increase with food. She has had increased cough. She's been taking some ibuprofen but otherwise has run out of her hydrocodone a couple weeks ago. 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 The patient states occasionally she has some chest pain after she eats that feels like reflux. Not currently having that. No focal leg swelling. The pain in her extremities feels like a sharp stabbing pain and then turns into a burning pain.  Past Medical History  Diagnosis Date  . Hypertension   . Fibromyalgia   . Chronic back pain   . Chronic neck pain   . COPD (chronic obstructive pulmonary disease)   . Bursitis    Past Surgical History  Procedure Laterality Date  . Breast surgery      biopsy benign tumor   Family History  Problem Relation Age of Onset  . Gout Father   . Hypertension Father    History  Substance Use Topics  . Smoking status: Current Every Day Smoker -- 0.75 packs/day    Types: Cigarettes  . Smokeless tobacco: Never Used  . Alcohol Use: No   OB History    No data available     Review of Systems  Constitutional: Negative for fever.  Respiratory: Positive for cough and shortness of breath.   Cardiovascular: Positive for chest pain. Negative for leg  swelling.  Gastrointestinal: Positive for abdominal pain. Negative for vomiting.  Musculoskeletal: Positive for myalgias.  All other systems reviewed and are negative.     Allergies  Penicillins  Home Medications   Prior to Admission medications   Medication Sig Start Date End Date Taking? Authorizing Provider  albuterol (PROVENTIL HFA;VENTOLIN HFA) 108 (90 BASE) MCG/ACT inhaler Inhale 2 puffs into the lungs every 4 (four) hours as needed for wheezing or shortness of breath (cough, shortness of breath or wheezing.). 08/13/13   Emi Belfasteborah B Gessner, FNP  ibuprofen (ADVIL,MOTRIN) 200 MG tablet Take 800 mg by mouth 2 (two) times daily as needed. For pain.    Historical Provider, MD  lisinopril (PRINIVIL,ZESTRIL) 20 MG tablet Take 20 mg by mouth daily.    Historical Provider, MD  traMADol (ULTRAM) 50 MG tablet Take 1 tablet (50 mg total) by mouth every 6 (six) hours as needed for moderate pain. 08/13/13   Emi Belfasteborah B Gessner, FNP   BP 158/91 mmHg  Pulse 102  Temp(Src) 97.5 F (36.4 C) (Oral)  Resp 24  Ht 5\' 10"  (1.778 m)  Wt 298 lb 6 oz (135.342 kg)  BMI 42.81 kg/m2  SpO2 98% Physical Exam  Constitutional: She is oriented to person, place, and time. She appears well-developed and well-nourished.  obese  HENT:  Head: Normocephalic and atraumatic.  Right Ear: External ear normal.  Left Ear: External ear  normal.  Nose: Nose normal.  Eyes: Right eye exhibits no discharge. Left eye exhibits no discharge.  Cardiovascular: Normal rate, regular rhythm and normal heart sounds.   Pulmonary/Chest: Effort normal and breath sounds normal. She has no wheezes.  Abdominal: Soft. There is tenderness (superficial) in the right upper quadrant.  Musculoskeletal:  Mild tenderness without swelling or spasm to upper arms bilaterally  Neurological: She is alert and oriented to person, place, and time.  Skin: Skin is warm and dry.  Vitals reviewed.   ED Course  Procedures (including critical care  time) Labs Review Labs Reviewed  BASIC METABOLIC PANEL - Abnormal; Notable for the following:    CO2 21 (*)    Glucose, Bld 110 (*)    All other components within normal limits  LIPASE, BLOOD - Abnormal; Notable for the following:    Lipase 21 (*)    All other components within normal limits  HEPATIC FUNCTION PANEL - Abnormal; Notable for the following:    Alkaline Phosphatase 37 (*)    Indirect Bilirubin 0.2 (*)    All other components within normal limits  CBC  BRAIN NATRIURETIC PEPTIDE  CK  I-STAT TROPOININ, ED    Imaging Review Dg Chest 2 View  06/06/2014   CLINICAL DATA:  Shortness of breath, history of COPD  EXAM: CHEST  2 VIEW  COMPARISON:  10/13/2013  FINDINGS: Cardiomediastinal silhouette is stable. No acute infiltrate or pleural effusion. No pulmonary edema. Stable mild degenerative changes thoracic spine.  IMPRESSION: No active cardiopulmonary disease.   Electronically Signed   By: Natasha MeadLiviu  Pop M.D.   On: 06/06/2014 12:29     EKG Interpretation   Date/Time:  Monday Jun 06 2014 12:10:33 EDT Ventricular Rate:  95 PR Interval:  138 QRS Duration: 80 QT Interval:  352 QTC Calculation: 442 R Axis:   60 Text Interpretation:  Normal sinus rhythm Normal ECG no significant change  since May 2011 Confirmed by Criss AlvineGOLDSTON  MD, Kaled Allende ([id number]) on 06/06/2014  12:54:45 PM      MDM   Final diagnoses:  COPD exacerbation  Fibromyalgia    Patient's workup appears unremarkable. No significant wheezing, will refill her inhaler here and this is likely a chronic COPD issue. As for her chronic pain do not feel narcotics are warranted although she is specifically requesting tramadol. Her description is more of a neuropathic pain without neurologic dysfunction. Will start on Neurontin and try to get close follow[facility]up [facility]    Pricilla LovelessScott Lantz Hermann, MD 06/06/14 1554

## 2014-06-06 NOTE — ED Notes (Signed)
Pt reports history of COPD but she has felt more SOB over past month. She has been using her inhalers with no relief. She woke from her sleep several times last night unable to breathe. She also c.o "sharp pains all over my body" this week. She is A&Ox4, able to speak in full sentences but mildly labored

## 2014-07-04 ENCOUNTER — Emergency Department (HOSPITAL_COMMUNITY)
Admission: EM | Admit: 2014-07-04 | Discharge: 2014-07-04 | Disposition: A | Discharge status: Home / Self Care | Payer: Self-pay | Attending: Emergency Medicine | Admitting: Emergency Medicine

## 2014-07-04 ENCOUNTER — Encounter (HOSPITAL_COMMUNITY): Payer: Self-pay | Admitting: Family Medicine

## 2014-07-04 ENCOUNTER — Emergency Department (HOSPITAL_COMMUNITY): Payer: PRIVATE HEALTH INSURANCE

## 2014-07-04 DIAGNOSIS — K529 Noninfective gastroenteritis and colitis, unspecified: Secondary | ICD-10-CM | POA: Insufficient documentation

## 2014-07-04 DIAGNOSIS — I10 Essential (primary) hypertension: Secondary | ICD-10-CM | POA: Insufficient documentation

## 2014-07-04 DIAGNOSIS — Z3202 Encounter for pregnancy test, result negative: Secondary | ICD-10-CM | POA: Insufficient documentation

## 2014-07-04 DIAGNOSIS — Z791 Long term (current) use of non-steroidal anti-inflammatories (NSAID): Secondary | ICD-10-CM | POA: Insufficient documentation

## 2014-07-04 DIAGNOSIS — G8929 Other chronic pain: Secondary | ICD-10-CM | POA: Insufficient documentation

## 2014-07-04 DIAGNOSIS — Z880 Allergy status to penicillin: Secondary | ICD-10-CM | POA: Insufficient documentation

## 2014-07-04 DIAGNOSIS — Z79899 Other long term (current) drug therapy: Secondary | ICD-10-CM | POA: Insufficient documentation

## 2014-07-04 DIAGNOSIS — Z8739 Personal history of other diseases of the musculoskeletal system and connective tissue: Secondary | ICD-10-CM | POA: Insufficient documentation

## 2014-07-04 DIAGNOSIS — M797 Fibromyalgia: Secondary | ICD-10-CM | POA: Insufficient documentation

## 2014-07-04 DIAGNOSIS — Z720 Tobacco use: Secondary | ICD-10-CM | POA: Insufficient documentation

## 2014-07-04 DIAGNOSIS — J449 Chronic obstructive pulmonary disease, unspecified: Secondary | ICD-10-CM | POA: Insufficient documentation

## 2014-07-04 LAB — COMPREHENSIVE METABOLIC PANEL
ALT: 20 U/L (ref 14–54)
ANION GAP: 10 (ref 5–15)
AST: 23 U/L (ref 15–41)
Albumin: 3.5 g/dL (ref 3.5–5.0)
Alkaline Phosphatase: 42 U/L (ref 38–126)
BUN: 11 mg/dL (ref 6–20)
CALCIUM: 8.8 mg/dL — AB (ref 8.9–10.3)
CHLORIDE: 103 mmol/L (ref 101–111)
CO2: 23 mmol/L (ref 22–32)
CREATININE: 0.89 mg/dL (ref 0.44–1.00)
GFR calc non Af Amer: >60 mL/min (ref >60)
GLUCOSE: 110 mg/dL — AB (ref 65–99)
Potassium: 4 mmol/L (ref 3.5–5.1)
Sodium: 136 mmol/L (ref 135–145)
TOTAL PROTEIN: 6.6 g/dL (ref 6.5–8.1)
Total Bilirubin: 0.8 mg/dL (ref 0.3–1.2)

## 2014-07-04 LAB — CBC WITH DIFFERENTIAL/PLATELET
Basophils Absolute: 0 10*3/uL (ref 0.0–0.1)
Basophils Relative: 0 % (ref 0–1)
EOS PCT: 2 % (ref 0–5)
Eosinophils Absolute: 0.2 10*3/uL (ref 0.0–0.7)
HCT: 41.2 % (ref 36.0–46.0)
Hemoglobin: 13.8 g/dL (ref 12.0–15.0)
LYMPHS ABS: 2.7 10*3/uL (ref 0.7–4.0)
Lymphocytes Relative: 18 % (ref 12–46)
MCH: 29.6 pg (ref 26.0–34.0)
MCHC: 33.5 g/dL (ref 30.0–36.0)
MCV: 88.4 fL (ref 78.0–100.0)
Monocytes Absolute: 0.8 10*3/uL (ref 0.1–1.0)
Monocytes Relative: 5 % (ref 3–12)
Neutro Abs: 11.8 10*3/uL — ABNORMAL HIGH (ref 1.7–7.7)
Neutrophils Relative %: 75 % (ref 43–77)
Platelets: 301 10*3/uL (ref 150–400)
RBC: 4.66 MIL/uL (ref 3.87–5.11)
RDW: 15.1 % (ref 11.5–15.5)
WBC: 15.6 10*3/uL — ABNORMAL HIGH (ref 4.0–10.5)

## 2014-07-04 LAB — URINALYSIS, ROUTINE W REFLEX MICROSCOPIC
Glucose, UA: NEGATIVE mg/dL
Ketones, ur: 15 mg/dL — AB
NITRITE: NEGATIVE
PROTEIN: 100 mg/dL — AB
Specific Gravity, Urine: 1.037 — ABNORMAL HIGH (ref 1.005–1.030)
Urobilinogen, UA: 1 mg/dL (ref 0.0–1.0)
pH: 5.5 (ref 5.0–8.0)

## 2014-07-04 LAB — LIPASE, BLOOD: Lipase: 14 U/L — ABNORMAL LOW (ref 22–51)

## 2014-07-04 LAB — URINE MICROSCOPIC-ADD ON

## 2014-07-04 LAB — POC URINE PREG, ED: PREG TEST UR: NEGATIVE

## 2014-07-04 MED ORDER — ONDANSETRON HCL 4 MG/2ML IJ SOLN
4.0000 mg | Freq: Once | INTRAMUSCULAR | Status: AC
  Administered 2014-07-04: 4 mg via INTRAVENOUS
  Filled 2014-07-04: qty 2

## 2014-07-04 MED ORDER — HYDROCODONE-ACETAMINOPHEN 5-325 MG PO TABS: 1.0000 | ORAL_TABLET | Freq: Four times a day (QID) | ORAL | Status: DC | PRN

## 2014-07-04 MED ORDER — HYDROMORPHONE HCL 1 MG/ML IJ SOLN
1.0000 mg | Freq: Once | INTRAMUSCULAR | Status: AC
  Administered 2014-07-04: 1 mg via INTRAVENOUS
  Filled 2014-07-04: qty 1

## 2014-07-04 MED ORDER — METRONIDAZOLE 500 MG PO TABS: 500.0000 mg | ORAL_TABLET | Freq: Three times a day (TID) | ORAL | Status: DC

## 2014-07-04 MED ORDER — IOHEXOL 300 MG/ML  SOLN
100.0000 mL | Freq: Once | INTRAMUSCULAR | Status: AC | PRN
  Administered 2014-07-04: 100 mL via INTRAVENOUS

## 2014-07-04 MED ORDER — HYOSCYAMINE SULFATE 0.125 MG PO TABS
0.1250 mg | ORAL_TABLET | Freq: Once | ORAL | Status: DC
  Filled 2014-07-04: qty 1

## 2014-07-04 MED ORDER — PROMETHAZINE HCL 25 MG PO TABS: 25.0000 mg | ORAL_TABLET | Freq: Four times a day (QID) | ORAL | Status: DC | PRN

## 2014-07-04 MED ORDER — SODIUM CHLORIDE 0.9 % IV BOLUS (SEPSIS)
1000.0000 mL | Freq: Once | INTRAVENOUS | Status: AC
  Administered 2014-07-04: 1000 mL via INTRAVENOUS

## 2014-07-04 MED ORDER — CIPROFLOXACIN HCL 500 MG PO TABS: 500.0000 mg | ORAL_TABLET | Freq: Two times a day (BID) | ORAL | Status: DC

## 2014-07-04 MED ORDER — IOHEXOL 300 MG/ML  SOLN
25.0000 mL | Freq: Once | INTRAMUSCULAR | Status: AC | PRN
  Administered 2014-07-04: 25 mL via ORAL

## 2014-07-04 MED ORDER — HYOSCYAMINE SULFATE 0.125 MG SL SUBL
0.1250 mg | SUBLINGUAL_TABLET | Freq: Once | SUBLINGUAL | Status: AC
  Administered 2014-07-04: 0.125 mg via SUBLINGUAL

## 2014-07-04 NOTE — ED Notes (Signed)
Pt ambulated to the bathroom with ease 

## 2014-07-04 NOTE — ED Provider Notes (Signed)
CSN: [id number]     Arrival date & time 07/04/14  1049 History   First MD Initiated Contact with Patient 07/04/14 1152     Chief Complaint  Patient presents with  . Abdominal Pain     Patient is a 49 y.o. female presenting with abdominal pain. The history is provided by the patient. No language interpreter was used.  Abdominal Pain  Alexandra Velasquez presents for abdominal pain.  Alexandra Velasquez developed lower abdominal pain two days ago.  Pain is described as sharp, nonradiating, waxing and waning.  Pain is worse with activity.  Alexandra Velasquez has associated nausea and diarrhea.  Diarrhea is bloody, small volume stools.  No fever, chest pain, vomiting, dysuria.  No prior similar sxs.  No sick contacts, no bad food exposures.  Sxs are moderate, improving.    Past Medical History  Diagnosis Date  . Hypertension   . Fibromyalgia   . Chronic back pain   . Chronic neck pain   . COPD (chronic obstructive pulmonary disease)   . Bursitis    Past Surgical History  Procedure Laterality Date  . Breast surgery      biopsy benign tumor   Family History  Problem Relation Age of Onset  . Gout Father   . Hypertension Father    History  Substance Use Topics  . Smoking status: Current Every Day Smoker -- 0.75 packs/day    Types: Cigarettes  . Smokeless tobacco: Never Used  . Alcohol Use: No   OB History    No data available     Review of Systems  Gastrointestinal: Positive for abdominal pain.  All other systems reviewed and are negative.     Allergies  Penicillins  Home Medications   Prior to Admission medications   Medication Sig Start Date End Date Taking? Authorizing Provider  albuterol (PROVENTIL HFA;VENTOLIN HFA) 108 (90 BASE) MCG/ACT inhaler Inhale 2 puffs into the lungs every 4 (four) hours as needed for wheezing or shortness of breath (cough, shortness of breath or wheezing.). 08/13/13  Yes Emi Belfast, FNP  diclofenac sodium (VOLTAREN) 1 % GEL Apply 4 g topically 4 (four) times daily.   Yes  Historical Provider, MD  gabapentin (NEURONTIN) 100 MG capsule Take 1 capsule (100 mg total) by mouth 3 (three) times daily. 06/06/14  Yes Pricilla Loveless, MD  ibuprofen (ADVIL,MOTRIN) 200 MG tablet Take 800 mg by mouth 2 (two) times daily as needed. For pain.   Yes Historical Provider, MD  lisinopril (PRINIVIL,ZESTRIL) 20 MG tablet Take 1 tablet (20 mg total) by mouth daily. Patient taking differently: Take 40 mg by mouth daily.  06/06/14  Yes Pricilla Loveless, MD  Probiotic Product (PROBIOTIC PO) Take 2 tablets by mouth daily.   Yes Historical Provider, MD  traMADol (ULTRAM) 50 MG tablet Take 1 tablet (50 mg total) by mouth every 6 (six) hours as needed for moderate pain. Patient not taking: Reported on 06/06/2014 08/13/13   Emi Belfast, FNP   BP 126/81 mmHg  Pulse 88  Temp(Src) 97.7 F (36.5 C)  Resp 18  SpO2 97%  LMP 06/03/2014 Physical Exam  Constitutional: Alexandra Velasquez is oriented to person, place, and time. Alexandra Velasquez appears well-developed and well-nourished.  HENT:  Head: Normocephalic and atraumatic.  Cardiovascular: Normal rate and regular rhythm.   No murmur heard. Pulmonary/Chest: Effort normal and breath sounds normal. No respiratory distress.  Abdominal: Soft. There is no rebound and no guarding.  Mild lower abdominal tenderness  Musculoskeletal: Alexandra Velasquez exhibits no edema or tenderness.  Neurological: Alexandra Velasquez is alert and oriented to person, place, and time.  Skin: Skin is warm and dry.  Psychiatric: Alexandra Velasquez has a normal mood and affect. Her behavior is normal.  Nursing note and vitals reviewed.   ED Course  Procedures (including critical care time) Labs Review Labs Reviewed  CBC WITH DIFFERENTIAL/PLATELET - Abnormal; Notable for the following:    WBC 15.6 (*)    Neutro Abs 11.8 (*)    All other components within normal limits  COMPREHENSIVE METABOLIC PANEL - Abnormal; Notable for the following:    Glucose, Bld 110 (*)    Calcium 8.8 (*)    All other components within normal limits   LIPASE, BLOOD - Abnormal; Notable for the following:    Lipase 14 (*)    All other components within normal limits  URINALYSIS, ROUTINE W REFLEX MICROSCOPIC (NOT AT [facility]) - Abnormal; Notable for the following:    Color, Urine RED (*)    APPearance TURBID (*)    Specific Gravity, Urine 1.037 (*)    Hgb urine dipstick LARGE (*)    Bilirubin Urine MODERATE (*)    Ketones, ur 15 (*)    Protein, ur 100 (*)    Leukocytes, UA SMALL (*)    All other components within normal limits  URINE MICROSCOPIC-ADD ON - Abnormal; Notable for the following:    Squamous Epithelial / LPF MANY (*)    Bacteria, UA MANY (*)    All other components within normal limits  POC URINE PREG, ED    Imaging Review Ct Abdomen Pelvis W Contrast  07/04/2014   CLINICAL DATA:  Upper and lower abdominal pain, diarrhea, constipation  EXAM: CT ABDOMEN AND PELVIS WITH CONTRAST  TECHNIQUE: Multidetector CT imaging of the abdomen and pelvis was performed using the standard protocol following bolus administration of intravenous contrast.  CONTRAST:  OMNIPAQUE IOHEXOL 300 MG/ML  SOLN  COMPARISON:  None.  FINDINGS: Sagittal images of the spine shows mild degenerative changes lower thoracic and lumbar spine. There are streaky artifacts from patient's large body habitus.  The lung bases are unremarkable. Enhanced liver is unremarkable. No calcified gallstones are noted within gallbladder. Enhanced pancreas, spleen and adrenal glands are unremarkable. There is a lymph node just above the pancreatic head medial to caudate lobe of the liver measures 1.4 by 0.9 cm. A portal lymph node just medial to pancreatic head measures 1.3 by 1 cm. These are borderline by size criteria.  No retroperitoneal adenopathy.  The the kidneys are symmetrical in size and enhancement. No hydronephrosis or hydroureter. Delayed renal images shows bilateral renal symmetrical excretion. Bilateral visualized proximal ureter is unremarkable. No aortic aneurysm. Mild  atherosclerotic calcifications are noted distal abdominal aorta.  There is no small bowel obstruction. The terminal ileum is unremarkable. Normal appendix clearly visualized in coronal image 41.  There is abnormal thickening of colonic wall in distal transverse colon. Abnormal thickening of colonic wall in splenic flexure of the colon and subtle mild stranding of pericolonic fat. This is best seen in coronal image 56. Mild thickening of colonic wall in proximal left colon. Findings are consistent with segmental colitis. No pericolonic abscess. There is no evidence of perforation. No evidence of acute diverticulitis. The distal left colon and sigmoid colon are unremarkable. The uterus and adnexa are unremarkable. No adnexal mass. No pelvic ascites or adenopathy. There is a dominant follicle within right ovary measures 1.4 cm. Small follicle is noted within left ovary.  IMPRESSION: 1. There is abnormal thickening  of colonic wall in distal transverse colon splenic flexure of the colon and mild thickening of colonic wall in proximal descending colon. Findings are consistent with segmental colitis. There is no evidence of pericolonic abscess or perforation. No evidence of acute diverticulitis. 2. No small bowel obstruction. 3. Normal appendix.  No pericecal inflammation. 4. Borderline enlarged for portal and peripancreatic lymph nodes probable reactive. 5. No hydronephrosis or hydroureter.   Electronically Signed   By: Natasha Mead M.D.   On: 07/04/2014 14:04     EKG Interpretation None      MDM   Final diagnoses:  Colitis, acute    Patient here for evaluation of abdominal pain, hematochezia. History and CT are consistent with acute colitis, unclear infectious versus inflammatory. Treating for potential infectious source with Cipro and Flagyl. Patient feels improved on recheck. Discussed outpatient follow-up with family medicine and will need GI referral. Discussed home care and return  precautions.    Tilden Fossa, MD 07/04/14 [phone]

## 2014-07-04 NOTE — Discharge Instructions (Signed)

## 2014-07-04 NOTE — ED Notes (Signed)
Pt here for upper and lower abd pain, diarrhea and constipation. sts LBM Friday. Sts blood in stool.

## 2015-02-06 MED FILL — HYDROCODON-APAP 5-325: 5-325 | 30 days supply | Qty: 48 | Fill #0

## 2015-02-09 MED FILL — traMADol HCL 50 MG TABS: 50 | 8 days supply | Qty: 90 | Fill #0

## 2015-02-09 MED FILL — SIMVASTATIN 10 MG TABLET: 10 | 30 days supply | Qty: 30 | Fill #0

## 2015-03-08 MED FILL — HYDROCODON-APAP 5-325: 5-325 | 12 days supply | Qty: 48 | Fill #0

## 2015-03-13 MED FILL — traMADol HCL 50 MG TABS: 50 | 8 days supply | Qty: 90 | Fill #1

## 2015-04-07 MED FILL — HYDROCODON-APAP 5-325: 5-325 | 12 days supply | Qty: 48 | Fill #0

## 2015-04-11 MED FILL — traMADol HCL 50 MG TABS: 50 | 8 days supply | Qty: 90 | Fill #0

## 2015-04-13 ENCOUNTER — Encounter (HOSPITAL_COMMUNITY): Payer: Self-pay | Admitting: *Deleted

## 2015-04-14 ENCOUNTER — Other Ambulatory Visit: Payer: Self-pay | Admitting: Gastroenterology

## 2015-04-20 ENCOUNTER — Encounter (HOSPITAL_COMMUNITY): Payer: Self-pay

## 2015-04-20 ENCOUNTER — Ambulatory Visit (HOSPITAL_COMMUNITY)
Admission: RE | Admit: 2015-04-20 | Discharge: 2015-04-20 | Disposition: A | Discharge status: Home / Self Care | Payer: Self-pay | Source: Ambulatory Visit | Attending: Gastroenterology | Admitting: Gastroenterology

## 2015-04-20 ENCOUNTER — Ambulatory Visit (HOSPITAL_COMMUNITY): Payer: Self-pay | Admitting: Certified Registered Nurse Anesthetist

## 2015-04-20 ENCOUNTER — Encounter (HOSPITAL_COMMUNITY)
Admission: RE | Disposition: A | Discharge status: Home / Self Care | Payer: Self-pay | Source: Ambulatory Visit | Attending: Gastroenterology

## 2015-04-20 DIAGNOSIS — R109 Unspecified abdominal pain: Secondary | ICD-10-CM | POA: Insufficient documentation

## 2015-04-20 DIAGNOSIS — Z6841 Body Mass Index (BMI) 40.0 and over, adult: Secondary | ICD-10-CM | POA: Insufficient documentation

## 2015-04-20 DIAGNOSIS — Z7984 Long term (current) use of oral hypoglycemic drugs: Secondary | ICD-10-CM | POA: Insufficient documentation

## 2015-04-20 DIAGNOSIS — Z79891 Long term (current) use of opiate analgesic: Secondary | ICD-10-CM | POA: Insufficient documentation

## 2015-04-20 DIAGNOSIS — E119 Type 2 diabetes mellitus without complications: Secondary | ICD-10-CM | POA: Insufficient documentation

## 2015-04-20 DIAGNOSIS — I10 Essential (primary) hypertension: Secondary | ICD-10-CM | POA: Insufficient documentation

## 2015-04-20 DIAGNOSIS — F17210 Nicotine dependence, cigarettes, uncomplicated: Secondary | ICD-10-CM | POA: Insufficient documentation

## 2015-04-20 DIAGNOSIS — K640 First degree hemorrhoids: Secondary | ICD-10-CM | POA: Insufficient documentation

## 2015-04-20 DIAGNOSIS — J449 Chronic obstructive pulmonary disease, unspecified: Secondary | ICD-10-CM | POA: Insufficient documentation

## 2015-04-20 DIAGNOSIS — K635 Polyp of colon: Secondary | ICD-10-CM | POA: Insufficient documentation

## 2015-04-20 DIAGNOSIS — R1084 Generalized abdominal pain: Secondary | ICD-10-CM | POA: Diagnosis present

## 2015-04-20 DIAGNOSIS — Z79899 Other long term (current) drug therapy: Secondary | ICD-10-CM | POA: Insufficient documentation

## 2015-04-20 DIAGNOSIS — R194 Change in bowel habit: Secondary | ICD-10-CM | POA: Insufficient documentation

## 2015-04-20 HISTORY — DX: Type 2 diabetes mellitus without complications: E11.9

## 2015-04-20 HISTORY — PX: COLONOSCOPY: SHX5424

## 2015-04-20 HISTORY — DX: Personal history of urinary calculi: Z87.442

## 2015-04-20 LAB — GLUCOSE, CAPILLARY: Glucose-Capillary: 122 mg/dL — ABNORMAL HIGH (ref 65–99)

## 2015-04-20 SURGERY — COLONOSCOPY
Anesthesia: Monitor Anesthesia Care

## 2015-04-20 MED ORDER — PROPOFOL 10 MG/ML IV BOLUS
INTRAVENOUS | Status: DC | PRN
  Administered 2015-04-20 (×2): 30 mg via INTRAVENOUS
  Administered 2015-04-20 (×3): 50 mg via INTRAVENOUS
  Administered 2015-04-20: 10 mg via INTRAVENOUS
  Administered 2015-04-20: 30 mg via INTRAVENOUS
  Administered 2015-04-20: 50 mg via INTRAVENOUS

## 2015-04-20 MED ORDER — FENTANYL CITRATE (PF) 100 MCG/2ML IJ SOLN
INTRAMUSCULAR | Status: AC
  Filled 2015-04-20: qty 2

## 2015-04-20 MED ORDER — PROPOFOL 10 MG/ML IV BOLUS
INTRAVENOUS | Status: AC
  Filled 2015-04-20: qty 20

## 2015-04-20 MED ORDER — PROPOFOL 500 MG/50ML IV EMUL
INTRAVENOUS | Status: DC | PRN
  Administered 2015-04-20: 75 ug/kg/min via INTRAVENOUS

## 2015-04-20 MED ORDER — LACTATED RINGERS IV SOLN
INTRAVENOUS | Status: DC
  Administered 2015-04-20: 1000 mL via INTRAVENOUS

## 2015-04-20 MED ORDER — ONDANSETRON HCL 4 MG/2ML IJ SOLN
INTRAMUSCULAR | Status: DC | PRN
  Administered 2015-04-20: 4 mg via INTRAVENOUS

## 2015-04-20 MED ORDER — ONDANSETRON HCL 4 MG/2ML IJ SOLN
INTRAMUSCULAR | Status: AC
  Filled 2015-04-20: qty 2

## 2015-04-20 MED ORDER — SODIUM CHLORIDE 0.9 % IV SOLN
INTRAVENOUS | Status: DC
Start: 2015-04-20 — End: 2015-04-20

## 2015-04-20 MED ORDER — LIDOCAINE HCL (CARDIAC) 20 MG/ML IV SOLN
INTRAVENOUS | Status: DC | PRN
  Administered 2015-04-20: 100 mg via INTRAVENOUS

## 2015-04-20 MED ORDER — FENTANYL CITRATE (PF) 100 MCG/2ML IJ SOLN
INTRAMUSCULAR | Status: DC | PRN
  Administered 2015-04-20 (×2): 50 ug via INTRAVENOUS

## 2015-04-20 MED ORDER — LIDOCAINE HCL (CARDIAC) 20 MG/ML IV SOLN
INTRAVENOUS | Status: AC
  Filled 2015-04-20: qty 5

## 2015-04-20 MED ORDER — PROPOFOL 10 MG/ML IV BOLUS
INTRAVENOUS | Status: AC
  Filled 2015-04-20: qty 60

## 2015-04-20 NOTE — Transfer of Care (Signed)
Immediate Anesthesia Transfer of Care Note  Patient: Alexandra Velasquez  Procedure(s) Performed: Procedure(s): COLONOSCOPY (N/A)  Patient Location: ENDO  Anesthesia Type:MAC  Level of Consciousness:  sedated, patient cooperative and responds to stimulation  Airway & Oxygen Therapy:Patient Spontanous Breathing and Patient connected to face mask oxgen  Post-op Assessment:  Report given to ENDO RN and Post -op Vital signs reviewed and stable  Post vital signs:  Reviewed and stable  Last Vitals:  Filed Vitals:   04/20/15 1137 04/20/15 1138  BP:  131/84  Pulse: 85   Temp: 37 C   Resp: 12     Complications: No apparent anesthesia complications

## 2015-04-20 NOTE — Interval H&P Note (Signed)
History and Physical Interval Note:  04/20/2015 10:02 AM  Alexandra Velasquez  has presented today for surgery, with the diagnosis of abdominal pain and change in bowel habits  The various methods of treatment have been discussed with the patient and family. After consideration of risks, benefits and other options for treatment, the patient has consented to  Procedure(s): COLONOSCOPY (N/A) as a surgical intervention .  The patient's history has been reviewed, patient examined, no change in status, stable for surgery.  I have reviewed the patient's chart and labs.  Questions were answered to the patient's satisfaction.     Jyren Cerasoli C.

## 2015-04-20 NOTE — Discharge Instructions (Signed)
Avoid all aspirin products for 2 weeks.

## 2015-04-20 NOTE — Anesthesia Preprocedure Evaluation (Addendum)
Anesthesia Evaluation  Patient identified by MRN, date of birth, ID band Patient awake    Reviewed: Allergy & Precautions, H&P , NPO status , Patient's Chart, lab work & pertinent test results, reviewed documented beta blocker date and time   Airway Mallampati: II  TM Distance: >3 FB Neck ROM: full    Dental  (+) Dental Advisory Given, Missing, Partial Upper Missing left front upper teeth:   Pulmonary COPD,  COPD inhaler, Current Smoker,    Pulmonary exam normal breath sounds clear to auscultation       Cardiovascular hypertension, Pt. on medications Normal cardiovascular exam Rhythm:regular Rate:Normal     Neuro/Psych negative neurological ROS  negative psych ROS   GI/Hepatic negative GI ROS, Neg liver ROS,   Endo/Other  diabetes, Well Controlled, Type 2, Oral Hypoglycemic AgentsMorbid obesity  Renal/GU negative Renal ROS  negative genitourinary   Musculoskeletal   Abdominal (+) + obese,   Peds  Hematology negative hematology ROS (+)   Anesthesia Other Findings   Reproductive/Obstetrics negative OB ROS                            Anesthesia Physical Anesthesia Plan  ASA: III  Anesthesia Plan: MAC   Post-op Pain Management:    Induction:   Airway Management Planned:   Additional Equipment:   Intra-op Plan:   Post-operative Plan:   Informed Consent: I have reviewed the patients History and Physical, chart, labs and discussed the procedure including the risks, benefits and alternatives for the proposed anesthesia with the patient or authorized representative who has indicated his/her understanding and acceptance.   Dental Advisory Given  Plan Discussed with: CRNA  Anesthesia Plan Comments:         Anesthesia Quick Evaluation

## 2015-04-20 NOTE — H&P (Signed)
Date of Initial H&P: 04/12/15  History reviewed, patient examined, no change in status, stable for surgery. 

## 2015-04-20 NOTE — Op Note (Signed)
[facility] Patient Name: Alexandra Velasquez Procedure Date: 04/20/2015 MRN: [id number] Attending MD: Shirley Friar , MD Date of Birth: [birth year] CSN:  Age: 50 Admit Type: Outpatient Procedure:                Colonoscopy Indications:              Generalized abdominal pain, Change in bowel habits Providers:                Shirley Friar, MD, Dwain Sarna, RN, Darletta Moll, Technician Referring MD:              Medicines:                Propofol per Anesthesia, Monitored Anesthesia Care Complications:            No immediate complications. Estimated Blood Loss:     Estimated blood loss was minimal. Procedure:                Pre-Anesthesia Assessment:                           - Prior to the procedure, a History and Physical                            was performed, and patient medications and                            allergies were reviewed. The patient's tolerance of                            previous anesthesia was also reviewed. The risks                            and benefits of the procedure and the sedation                            options and risks were discussed with the patient.                            All questions were answered, and informed consent                            was obtained. Prior Anticoagulants: The patient has                            taken no previous anticoagulant or antiplatelet                            agents. ASA Grade Assessment: III - A patient with                            severe systemic disease. After reviewing the risks  and benefits, the patient was deemed in                            satisfactory condition to undergo the procedure.                           After obtaining informed consent, the colonoscope                            was passed under direct vision. Throughout the                            procedure, the patient's blood pressure, pulse,  and                            oxygen saturations were monitored continuously. The                            EC-[id number] ([id number]) scope was introduced through                            the anus and advanced to the the cecum, identified                            by appendiceal orifice and ileocecal valve. The                            colonoscopy was performed without difficulty. The                            patient tolerated the procedure well. The quality                            of the bowel preparation was fair. The ileocecal                            valve, the appendiceal orifice and the rectum were                            photographed. Scope In: 11:03:13 AM Scope Out: 11:32:43 AM Scope Withdrawal Time: 0 hours 23 minutes 10 seconds  Total Procedure Duration: 0 hours 29 minutes 30 seconds  Findings:      The perianal and digital rectal examinations were normal.      A 10 mm polyp was found in the cecum. The polyp was semi-sessile. The       polyp was removed with a hot snare. Resection and retrieval were       complete. Estimated blood loss: none.      A 12 mm polyp was found in the sigmoid colon. The polyp was       semi-sessile. The polyp was removed with a hot snare. Resection and       retrieval were complete. Estimated blood loss: none.      A 3 mm polyp was found in the sigmoid colon. The polyp was semi-sessile.  The polyp was removed with a cold biopsy forceps. Resection and       retrieval were complete. Estimated blood loss was minimal.      Internal hemorrhoids were found during retroflexion. The hemorrhoids       were small and Grade I (internal hemorrhoids that do not prolapse). Impression:               - Preparation of the colon was fair.                           - One 10 mm polyp in the cecum, removed with a hot                            snare. Resected and retrieved.                           - One 12 mm polyp in the sigmoid colon, removed                             with a hot snare. Resected and retrieved.                           - One 3 mm polyp in the sigmoid colon, removed with                            a cold biopsy forceps. Resected and retrieved.                           - Internal hemorrhoids. Moderate Sedation:      N/A- Per Anesthesia Care Recommendation:           - Await pathology results.                           - No aspirin, ibuprofen, naproxen, or other                            non-steroidal anti-inflammatory drugs for 2 weeks.                           - Resume previous diet.                           - Repeat colonoscopy for surveillance based on                            pathology results. Procedure Code(s):        --- Professional ---                           [id number], Colonoscopy, flexible; with removal of                            tumor(s), polyp(s), or other lesion(s) by snare  technique                           [id number], 59, Colonoscopy, flexible; with biopsy,                            single or multiple Diagnosis Code(s):        --- Professional ---                           R10.84, Generalized abdominal pain                           R19.4, Change in bowel habit                           D12.0, Benign neoplasm of cecum                           D12.5, Benign neoplasm of sigmoid colon                           K64.0, First degree hemorrhoids CPT copyright 2016 [employer]. All rights reserved. The codes documented in this report are preliminary and upon coder review may  be revised to meet current compliance requirements. Charlott RakesVincent Dustyn Dansereau, MD Shirley FriarVincent C Ieshia Hatcher, MD 04/20/2015 11:38:27 AM This report has been signed electronically. Number of Addenda: 0

## 2015-04-20 NOTE — Anesthesia Postprocedure Evaluation (Signed)
Anesthesia Post Note  Patient: Precious GildingMelissa K Pendley  Procedure(s) Performed: Procedure(s) (LRB): COLONOSCOPY (N/A)  Patient location during evaluation: PACU Anesthesia Type: MAC Level of consciousness: awake and alert Pain management: pain level controlled Vital Signs Assessment: post-procedure vital signs reviewed and stable Respiratory status: spontaneous breathing, nonlabored ventilation, respiratory function stable and patient connected to nasal cannula oxygen Cardiovascular status: blood pressure returned to baseline and stable Postop Assessment: no signs of nausea or vomiting Anesthetic complications: no    Last Vitals:  Filed Vitals:   04/20/15 1137 04/20/15 1138  BP:  131/84  Pulse: 85   Temp: 37 C   Resp: 12     Last Pain: There were no vitals filed for this visit.               Sharniece Gibbon L

## 2015-04-21 ENCOUNTER — Encounter (HOSPITAL_COMMUNITY): Payer: Self-pay | Admitting: Gastroenterology

## 2015-05-05 MED FILL — traMADol HCL 50 MG TABS: 50 | 11 days supply | Qty: 90 | Fill #1

## 2015-05-07 ENCOUNTER — Encounter (HOSPITAL_COMMUNITY): Payer: Self-pay | Admitting: Emergency Medicine

## 2015-05-07 ENCOUNTER — Emergency Department (HOSPITAL_COMMUNITY)
Admission: EM | Admit: 2015-05-07 | Discharge: 2015-05-07 | Disposition: A | Discharge status: Home / Self Care | Payer: MEDICAID | Attending: Emergency Medicine | Admitting: Emergency Medicine

## 2015-05-07 DIAGNOSIS — R110 Nausea: Secondary | ICD-10-CM | POA: Insufficient documentation

## 2015-05-07 DIAGNOSIS — Z7984 Long term (current) use of oral hypoglycemic drugs: Secondary | ICD-10-CM | POA: Insufficient documentation

## 2015-05-07 DIAGNOSIS — Z79899 Other long term (current) drug therapy: Secondary | ICD-10-CM | POA: Insufficient documentation

## 2015-05-07 DIAGNOSIS — E119 Type 2 diabetes mellitus without complications: Secondary | ICD-10-CM | POA: Insufficient documentation

## 2015-05-07 DIAGNOSIS — K0889 Other specified disorders of teeth and supporting structures: Secondary | ICD-10-CM | POA: Insufficient documentation

## 2015-05-07 DIAGNOSIS — Z880 Allergy status to penicillin: Secondary | ICD-10-CM | POA: Insufficient documentation

## 2015-05-07 DIAGNOSIS — I10 Essential (primary) hypertension: Secondary | ICD-10-CM | POA: Insufficient documentation

## 2015-05-07 DIAGNOSIS — F17210 Nicotine dependence, cigarettes, uncomplicated: Secondary | ICD-10-CM | POA: Insufficient documentation

## 2015-05-07 DIAGNOSIS — G8929 Other chronic pain: Secondary | ICD-10-CM | POA: Insufficient documentation

## 2015-05-07 DIAGNOSIS — J449 Chronic obstructive pulmonary disease, unspecified: Secondary | ICD-10-CM | POA: Insufficient documentation

## 2015-05-07 DIAGNOSIS — Z8739 Personal history of other diseases of the musculoskeletal system and connective tissue: Secondary | ICD-10-CM | POA: Insufficient documentation

## 2015-05-07 DIAGNOSIS — Z87442 Personal history of urinary calculi: Secondary | ICD-10-CM | POA: Insufficient documentation

## 2015-05-07 DIAGNOSIS — R109 Unspecified abdominal pain: Secondary | ICD-10-CM | POA: Insufficient documentation

## 2015-05-07 MED ORDER — ONDANSETRON HCL 4 MG/2ML IJ SOLN: 4.0000 mg | Freq: Once | INTRAMUSCULAR | Status: DC

## 2015-05-07 MED ORDER — HYDROCODONE-ACETAMINOPHEN 5-325 MG PO TABS: 1.0000 | ORAL_TABLET | Freq: Four times a day (QID) | ORAL | Status: DC | PRN

## 2015-05-07 MED ORDER — OXYCODONE-ACETAMINOPHEN 5-325 MG PO TABS
1.0000 | ORAL_TABLET | Freq: Once | ORAL | Status: AC
  Administered 2015-05-07: 1 via ORAL
  Filled 2015-05-07: qty 1

## 2015-05-07 MED ORDER — ONDANSETRON 4 MG PO TBDP: 4.0000 mg | ORAL_TABLET | Freq: Three times a day (TID) | ORAL | Status: DC | PRN

## 2015-05-07 MED ORDER — ONDANSETRON 4 MG PO TBDP
4.0000 mg | ORAL_TABLET | Freq: Once | ORAL | Status: AC
  Administered 2015-05-07: 4 mg via ORAL
  Filled 2015-05-07: qty 1

## 2015-05-07 NOTE — Discharge Instructions (Signed)

## 2015-05-07 NOTE — ED Notes (Signed)
Pt. reports worsening left upper molar pain onset last week .

## 2015-05-07 NOTE — ED Provider Notes (Signed)
CSN: [id number]     Arrival date & time 05/07/15  0539 History   First MD Initiated Contact with Patient 05/07/15 0617     Chief Complaint  Patient presents with  . Dental Pain     (Consider location/radiation/quality/duration/timing/severity/associated sxs/prior Treatment) HPI Comments: Pt is a 50 y/o female with recent filled cavity followed by weeks of pain and subsequent follow visits with the dentist, the pt presents to the ER with continued pain, resolving gum swelling, and nausea from one week of clindamycin.  Pain is rated 10/10, unrelieved with OTC pain meds, worse with eating or touching.  She has resolving gum swelling, no swelling in face, cheeks or neck.  No fever, chills, sweats, vomiting, abdominal pain.  No difficulty speaking, swallowing, breathing.  She wants "it just pulled out."    Patient is a 50 y.o. female presenting with tooth pain.  Dental Pain Location:  Upper Upper teeth location:  16/LU 3rd molar Quality:  Shooting and constant Severity:  Severe Onset quality:  Gradual Duration:  3 weeks Timing:  Constant Progression:  Waxing and waning Chronicity:  Recurrent Context comment:  Recently filled cavity Previous work-up:  Filled cavity and dental exam Relieved by:  Nothing Worsened by:  Touching Ineffective treatments: multiple dentist visit, 1 week of clindamycin. Associated symptoms: gum swelling   Associated symptoms: no congestion, no difficulty swallowing, no drooling, no facial pain, no facial swelling, no fever, no headaches, no neck pain, no neck swelling, no oral bleeding, no oral lesions and no trismus        Past Medical History  Diagnosis Date  . Hypertension   . Fibromyalgia   . Chronic back pain   . Chronic neck pain   . COPD (chronic obstructive pulmonary disease) (HCC)   . Bursitis   . Diabetes mellitus without complication (HCC)   . History of kidney stones 30 yrs ago   Past Surgical History  Procedure Laterality Date  . Breast  surgery      biopsy benign tumor  . Colonoscopy N/A 04/20/2015    Procedure: COLONOSCOPY;  Surgeon: Charlott Rakes, MD;  Location: [facility];  Service: Endoscopy;  Laterality: N/A;   Family History  Problem Relation Age of Onset  . Gout Father   . Hypertension Father    Social History  Substance Use Topics  . Smoking status: Current Every Day Smoker -- 0.75 packs/day    Types: Cigarettes  . Smokeless tobacco: Never Used  . Alcohol Use: No   OB History    No data available     Review of Systems  Constitutional: Negative for fever, chills, diaphoresis, activity change, appetite change and fatigue.  HENT: Positive for dental problem. Negative for congestion, drooling, ear pain, facial swelling, mouth sores, rhinorrhea, sore throat, tinnitus, trouble swallowing and voice change.   Cardiovascular: Negative.   Gastrointestinal: Positive for nausea and abdominal pain. Negative for vomiting, diarrhea and constipation.  Musculoskeletal: Negative.  Negative for neck pain.  Neurological: Negative for headaches.  Psychiatric/Behavioral: Negative.   All other systems reviewed and are negative.     Allergies  Penicillins  Home Medications   Prior to Admission medications   Medication Sig Start Date End Date Taking? Authorizing Provider  acetaminophen (TYLENOL) 500 MG tablet Take 500 mg by mouth every 6 (six) hours as needed (Pain).    Historical Provider, MD  albuterol (PROVENTIL HFA;VENTOLIN HFA) 108 (90 BASE) MCG/ACT inhaler Inhale 2 puffs into the lungs every 4 (four) hours as needed for  wheezing or shortness of breath (cough, shortness of breath or wheezing.). 08/13/13   Emi Belfasteborah B Gessner, FNP  gabapentin (NEURONTIN) 100 MG capsule Take 1 capsule (100 mg total) by mouth 3 (three) times daily. 06/06/14   Pricilla LovelessScott Goldston, MD  HYDROcodone-acetaminophen (NORCO/VICODIN) 5-325 MG tablet Take 1-2 tablets by mouth every 6 (six) hours as needed for severe pain. 05/07/15   Danelle BerryLeisa Ashawnti Tangen, PA-C    ibuprofen (ADVIL,MOTRIN) 200 MG tablet Take 800 mg by mouth 2 (two) times daily as needed. For pain.    Historical Provider, MD  lisinopril (PRINIVIL,ZESTRIL) 20 MG tablet Take 1 tablet (20 mg total) by mouth daily. Patient taking differently: Take 40 mg by mouth daily.  06/06/14   Pricilla LovelessScott Goldston, MD  metFORMIN (GLUCOPHAGE) 1000 MG tablet Take 500 mg by mouth 2 (two) times daily with a meal.    Historical Provider, MD  ondansetron (ZOFRAN ODT) 4 MG disintegrating tablet Take 1 tablet (4 mg total) by mouth every 8 (eight) hours as needed for nausea or vomiting. 05/07/15   Danelle BerryLeisa Doak Mah, PA-C  simvastatin (ZOCOR) 10 MG tablet Take 10 mg by mouth daily.    Historical Provider, MD  traMADol (ULTRAM) 50 MG tablet Take 1 tablet (50 mg total) by mouth every 6 (six) hours as needed for moderate pain. 08/13/13   Emi Belfasteborah B Gessner, FNP   BP 102/74 mmHg  Pulse 65  Temp(Src) 97.3 F (36.3 C) (Oral)  Resp 16  Ht 5\' 10"  (1.778 m)  Wt 138.347 kg  BMI 43.76 kg/m2  SpO2 93%  LMP 04/20/2015 Physical Exam  Constitutional: She is oriented to person, place, and time. Vital signs are normal. She appears well-developed and well-nourished. She is cooperative.  Non-toxic appearance. She does not have a sickly appearance. No distress.  HENT:  Head: Normocephalic and atraumatic.  Right Ear: External ear normal.  Left Ear: External ear normal.  Nose: Nose normal. Right sinus exhibits no maxillary sinus tenderness and no frontal sinus tenderness. Left sinus exhibits no maxillary sinus tenderness and no frontal sinus tenderness.  Mouth/Throat: Uvula is midline, oropharynx is clear and moist and mucous membranes are normal. She has dentures. No trismus in the jaw. No dental abscesses or uvula swelling. No oropharyngeal exudate, posterior oropharyngeal edema, posterior oropharyngeal erythema or tonsillar abscesses.    Eyes: Conjunctivae and EOM are normal. Pupils are equal, round, and reactive to light. Right eye exhibits  no discharge. Left eye exhibits no discharge. No scleral icterus.  Neck: Trachea normal, normal range of motion and full passive range of motion without pain. Neck supple. No JVD present. No tracheal deviation, no edema and no erythema present.  Cardiovascular: Normal rate and regular rhythm.   Pulmonary/Chest: Effort normal and breath sounds normal. No stridor. No respiratory distress.  Musculoskeletal: Normal range of motion. She exhibits no edema.  Lymphadenopathy:    She has no cervical adenopathy.  Neurological: She is alert and oriented to person, place, and time. She exhibits normal muscle tone. Coordination normal.  Skin: Skin is warm and dry. No rash noted. She is not diaphoretic. No erythema. No pallor.  Psychiatric: She has a normal mood and affect. Her behavior is normal. Judgment and thought content normal.  Nursing note and vitals reviewed.   ED Course  Procedures (including critical care time) Labs Review Labs Reviewed - No data to display  Imaging Review No results found. I have personally reviewed and evaluated these images and lab results as part of my medical decision-making.   EKG  Interpretation None      MDM   Patient with toothache.  No gross abscess.  Exam unconcerning for Ludwig's angina or spread of infection.  Pt is already on clindamycin x 1 week.  Urged to follow up with dentist tomorrow.  Pt is in good condition, VSS. Filed Vitals:   05/07/15 0645 05/07/15 0700  BP: 108/85 102/74  Pulse: 67 65  Temp:    Resp:       Final diagnoses:  Pain, dental       Danelle Berry, PA-C 05/07/15 1610  Tomasita Crumble, MD 05/07/15 1723

## 2015-05-11 MED FILL — HYDROCODON-APAP 5-325: 5-325 | 12 days supply | Qty: 48 | Fill #0

## 2015-06-09 MED FILL — HYDROCODON-APAP 5-325: 5-325 | 30 days supply | Qty: 48 | Fill #0

## 2015-06-20 MED FILL — traMADol HCL 50 MG TABS: 50 | 8 days supply | Qty: 90 | Fill #0

## 2015-07-07 MED FILL — traMADol HCL 50 MG TABS: 50 | 8 days supply | Qty: 90 | Fill #1

## 2015-07-10 MED FILL — HYDROCODON-APAP 5-325: 5-325 | 12 days supply | Qty: 48 | Fill #0

## 2015-08-09 MED FILL — HYDROCODON-APAP 5-325: 5-325 | 12 days supply | Qty: 48 | Fill #0

## 2015-08-14 MED FILL — traMADol HCL 50 MG TABS: 50 | 8 days supply | Qty: 90 | Fill #0

## 2015-09-08 MED FILL — HYDROCODON-APAP 5-325: 5-325 | 12 days supply | Qty: 48 | Fill #0

## 2015-09-08 MED FILL — traMADol HCL 50 MG TABS: 50 | 8 days supply | Qty: 90 | Fill #0

## 2015-10-04 MED FILL — traMADol HCL 50 MG TABS: 50 | 8 days supply | Qty: 90 | Fill #1

## 2015-10-04 MED FILL — HYDROCODON-APAP 7.5-325: 7.5-325 | 30 days supply | Qty: 60 | Fill #0

## 2015-10-23 MED FILL — traMADol HCL 50 MG TABS: 50 | 8 days supply | Qty: 90 | Fill #0

## 2015-11-03 MED FILL — HYDROCODON-APAP 7.5-325: 7.5-325 | 14 days supply | Qty: 28 | Fill #0

## 2015-11-13 MED FILL — traMADol HCL 50 MG TABS: 50 | 8 days supply | Qty: 90 | Fill #1

## 2015-11-17 MED FILL — HYDROCODON-APAP 7.5-325: 7.5-325 | 14 days supply | Qty: 28 | Fill #0

## 2015-12-04 MED FILL — HYDROCODON-APAP 7.5-325: 7.5-325 | 14 days supply | Qty: 28 | Fill #0

## 2015-12-08 MED FILL — traMADol HCL 50 MG TABS: 50 | 8 days supply | Qty: 90 | Fill #0

## 2015-12-18 MED FILL — HYDROCODON-APAP 7.5-325: 7.5-325 | 14 days supply | Qty: 28 | Fill #0

## 2015-12-27 MED FILL — traMADol HCL 50 MG TABS: 50 | 8 days supply | Qty: 90 | Fill #1

## 2016-01-01 MED FILL — HYDROCODON-APAP 7.5-325: 7.5-325 | 14 days supply | Qty: 28 | Fill #0

## 2016-01-16 MED FILL — HYDROCODON-APAP 7.5-325: 7.5-325 | 14 days supply | Qty: 28 | Fill #0

## 2016-01-30 MED FILL — HYDROCODON-APAP 7.5-325: 7.5-325 | 14 days supply | Qty: 30 | Fill #0

## 2016-02-05 MED FILL — traMADol HCL 50 MG TABS: 50 | 11 days supply | Qty: 90 | Fill #0

## 2016-02-13 MED FILL — HYDROCODON-APAP 7.5-325: 7.5-325 | 14 days supply | Qty: 30 | Fill #0

## 2016-02-27 MED FILL — HYDROCODON-APAP 7.5-325: 7.5-325 | 18 days supply | Qty: 72 | Fill #0

## 2016-03-08 MED FILL — traMADol HCL 50 MG TABS: 50 | 11 days supply | Qty: 90 | Fill #1

## 2016-04-03 MED FILL — HYDROCODON-APAP 7.5-325: 7.5-325 | 18 days supply | Qty: 72 | Fill #0

## 2016-04-12 MED FILL — traMADol HCL 50 MG TABS: 50 | 8 days supply | Qty: 90 | Fill #0

## 2016-05-10 MED FILL — traMADol HCL 50 MG TABS: 50 | 11 days supply | Qty: 90 | Fill #1

## 2016-10-09 MED FILL — traMADol HCL 50 MG TABS: 50 | 12 days supply | Qty: 90 | Fill #0

## 2017-01-20 MED FILL — traMADol HCL 50 MG TABS: 50 | 12 days supply | Qty: 90 | Fill #1

## 2018-07-30 ENCOUNTER — Encounter: Payer: Self-pay | Admitting: Podiatry

## 2018-07-30 ENCOUNTER — Ambulatory Visit (INDEPENDENT_AMBULATORY_CARE_PROVIDER_SITE_OTHER): Payer: BC Managed Care – PPO | Admitting: Podiatry

## 2018-07-30 ENCOUNTER — Ambulatory Visit (INDEPENDENT_AMBULATORY_CARE_PROVIDER_SITE_OTHER): Payer: BC Managed Care – PPO

## 2018-07-30 ENCOUNTER — Other Ambulatory Visit: Payer: Self-pay | Admitting: Podiatry

## 2018-07-30 ENCOUNTER — Other Ambulatory Visit: Payer: Self-pay

## 2018-07-30 VITALS — BP 111/60 | HR 80 | Temp 98.2°F | Resp 16

## 2018-07-30 DIAGNOSIS — M722 Plantar fascial fibromatosis: Secondary | ICD-10-CM

## 2018-07-30 DIAGNOSIS — M2042 Other hammer toe(s) (acquired), left foot: Secondary | ICD-10-CM

## 2018-07-30 DIAGNOSIS — M2041 Other hammer toe(s) (acquired), right foot: Secondary | ICD-10-CM | POA: Diagnosis not present

## 2018-07-30 MED ORDER — DICLOFENAC SODIUM 75 MG PO TBEC: 75.0000 mg | DELAYED_RELEASE_TABLET | Freq: Two times a day (BID) | ORAL | 2 refills | Status: AC

## 2018-07-30 NOTE — Patient Instructions (Signed)
For instructions on how to put on your Plantar Fascial Brace, please visit [email or web address]   Plantar Fasciitis (Heel Spur Syndrome) with Rehab The plantar fascia is a fibrous, ligament-like, soft-tissue structure that spans the bottom of the foot. Plantar fasciitis is a condition that causes pain in the foot due to inflammation of the tissue. SYMPTOMS   Pain and tenderness on the underneath side of the foot.  Pain that worsens with standing or walking. CAUSES  Plantar fasciitis is caused by irritation and injury to the plantar fascia on the underneath side of the foot. Common mechanisms of injury include:  Direct trauma to bottom of the foot.  Damage to a small nerve that runs under the foot where the main fascia attaches to the heel bone.  Stress placed on the plantar fascia due to bone spurs. RISK INCREASES WITH:   Activities that place stress on the plantar fascia (running, jumping, pivoting, or cutting).  Poor strength and flexibility.  Improperly fitted shoes.  Tight calf muscles.  Flat feet.  Failure to warm-up properly before activity.  Obesity. PREVENTION  Warm up and stretch properly before activity.  Allow for adequate recovery between workouts.  Maintain physical fitness:  Strength, flexibility, and endurance.  Cardiovascular fitness.  Maintain a health body weight.  Avoid stress on the plantar fascia.  Wear properly fitted shoes, including arch supports for individuals who have flat feet.  PROGNOSIS  If treated properly, then the symptoms of plantar fasciitis usually resolve without surgery. However, occasionally surgery is necessary.  RELATED COMPLICATIONS   Recurrent symptoms that may result in a chronic condition.  Problems of the lower back that are caused by compensating for the injury, such as limping.  Pain or weakness of the foot during push-off following surgery.  Chronic inflammation, scarring, and partial or complete  fascia tear, occurring more often from repeated injections.  TREATMENT  Treatment initially involves the use of ice and medication to help reduce pain and inflammation. The use of strengthening and stretching exercises may help reduce pain with activity, especially stretches of the Achilles tendon. These exercises may be performed at home or with a therapist. Your caregiver may recommend that you use heel cups of arch supports to help reduce stress on the plantar fascia. Occasionally, corticosteroid injections are given to reduce inflammation. If symptoms persist for greater than 6 months despite non-surgical (conservative), then surgery may be recommended.   MEDICATION   If pain medication is necessary, then nonsteroidal anti-inflammatory medications, such as aspirin and ibuprofen, or other minor pain relievers, such as acetaminophen, are often recommended.  Do not take pain medication within 7 days before surgery.  Prescription pain relievers may be given if deemed necessary by your caregiver. Use only as directed and only as much as you need.  Corticosteroid injections may be given by your caregiver. These injections should be reserved for the most serious cases, because they may only be given a certain number of times.  HEAT AND COLD  Cold treatment (icing) relieves pain and reduces inflammation. Cold treatment should be applied for 10 to 15 minutes every 2 to 3 hours for inflammation and pain and immediately after any activity that aggravates your symptoms. Use ice packs or massage the area with a piece of ice (ice massage).  Heat treatment may be used prior to performing the stretching and strengthening activities prescribed by your caregiver, physical therapist, or athletic trainer. Use a heat pack or soak the injury in warm water.  SEEK IMMEDIATE MEDICAL   CARE IF:  Treatment seems to offer no benefit, or the condition worsens.  Any medications produce adverse side effects.   EXERCISES- RANGE OF MOTION (ROM) AND STRETCHING EXERCISES - Plantar Fasciitis (Heel Spur Syndrome) These exercises may help you when beginning to rehabilitate your injury. Your symptoms may resolve with or without further involvement from your physician, physical therapist or athletic trainer. While completing these exercises, remember:   Restoring tissue flexibility helps normal motion to return to the joints. This allows healthier, less painful movement and activity.  An effective stretch should be held for at least 30 seconds.  A stretch should never be painful. You should only feel a gentle lengthening or release in the stretched tissue.  RANGE OF MOTION - Toe Extension, Flexion  Sit with your right / left leg crossed over your opposite knee.  Grasp your toes and gently pull them back toward the top of your foot. You should feel a stretch on the bottom of your toes and/or foot.  Hold this stretch for 10 seconds.  Now, gently pull your toes toward the bottom of your foot. You should feel a stretch on the top of your toes and or foot.  Hold this stretch for 10 seconds. Repeat  times. Complete this stretch 3 times per day.   RANGE OF MOTION - Ankle Dorsiflexion, Active Assisted  Remove shoes and sit on a chair that is preferably not on a carpeted surface.  Place right / left foot under knee. Extend your opposite leg for support.  Keeping your heel down, slide your right / left foot back toward the chair until you feel a stretch at your ankle or calf. If you do not feel a stretch, slide your bottom forward to the edge of the chair, while still keeping your heel down.  Hold this stretch for 10 seconds. Repeat 3 times. Complete this stretch 2 times per day.   STRETCH  Gastroc, Standing  Place hands on wall.  Extend right / left leg, keeping the front knee somewhat bent.  Slightly point your toes inward on your back foot.  Keeping your right / left heel on the floor and your  knee straight, shift your weight toward the wall, not allowing your back to arch.  You should feel a gentle stretch in the right / left calf. Hold this position for 10 seconds. Repeat 3 times. Complete this stretch 2 times per day.  STRETCH  Soleus, Standing  Place hands on wall.  Extend right / left leg, keeping the other knee somewhat bent.  Slightly point your toes inward on your back foot.  Keep your right / left heel on the floor, bend your back knee, and slightly shift your weight over the back leg so that you feel a gentle stretch deep in your back calf.  Hold this position for 10 seconds. Repeat 3 times. Complete this stretch 2 times per day.  STRETCH  Gastrocsoleus, Standing  Note: This exercise can place a lot of stress on your foot and ankle. Please complete this exercise only if specifically instructed by your caregiver.   Place the ball of your right / left foot on a step, keeping your other foot firmly on the same step.  Hold on to the wall or a rail for balance.  Slowly lift your other foot, allowing your body weight to press your heel down over the edge of the step.  You should feel a stretch in your right / left calf.  Hold this   position for 10 seconds.  Repeat this exercise with a slight bend in your right / left knee. Repeat 3 times. Complete this stretch 2 times per day.   STRENGTHENING EXERCISES - Plantar Fasciitis (Heel Spur Syndrome)  These exercises may help you when beginning to rehabilitate your injury. They may resolve your symptoms with or without further involvement from your physician, physical therapist or athletic trainer. While completing these exercises, remember:   Muscles can gain both the endurance and the strength needed for everyday activities through controlled exercises.  Complete these exercises as instructed by your physician, physical therapist or athletic trainer. Progress the resistance and repetitions only as guided.  STRENGTH -  Towel Curls  Sit in a chair positioned on a non-carpeted surface.  Place your foot on a towel, keeping your heel on the floor.  Pull the towel toward your heel by only curling your toes. Keep your heel on the floor. Repeat 3 times. Complete this exercise 2 times per day.  STRENGTH - Ankle Inversion  Secure one end of a rubber exercise band/tubing to a fixed object (table, pole). Loop the other end around your foot just before your toes.  Place your fists between your knees. This will focus your strengthening at your ankle.  Slowly, pull your big toe up and in, making sure the band/tubing is positioned to resist the entire motion.  Hold this position for 10 seconds.  Have your muscles resist the band/tubing as it slowly pulls your foot back to the starting position. Repeat 3 times. Complete this exercises 2 times per day.  Document Released: 01/07/2005 Document Revised: 04/01/2011 Document Reviewed: 04/21/2008 [employer] Patient Information 2014 [employer]. 

## 2018-08-04 NOTE — Progress Notes (Signed)
Subjective:   Patient ID: Alexandra Velasquez, female   DOB: 53 y.o.   MRN: [id number]   HPI Patient presents stating she is had a lot of pain in the bottom of both heels and it is been going on a long time she is had previous injections with helped for periods of time.  States that bad when she gets up the morning after sitting and patient is obese which is complicating factor for this particular condition.  Patient does not smoke likes to be active   Review of Systems  All other systems reviewed and are negative.       Objective:  Physical Exam Vitals signs and nursing note reviewed.  Constitutional:      Appearance: She is well-developed.  Pulmonary:     Effort: Pulmonary effort is normal.  Musculoskeletal: Normal range of motion.  Skin:    General: Skin is warm.  Neurological:     Mental Status: She is alert.     Neurovascular status intact muscle strength was found to be adequate range of motion within normal limits.  Patient is found to have exquisite discomfort plantar aspect heel region bilateral with inflammation fluid of the medial bed at insertion with moderate depression of the arch noted.  Patient is found to have good digital perfusion well oriented x3     Assessment:  Acute plantar fasciitis bilateral with inflammation fluid of the medial band bilateral     Plan:  H&P condition reviewed at today I did sterile prep and injected the plantar fascial bilateral 3 mg Kenalog 5 mg Xylocaine and applied fascial brace bilateral.  Gave instructions on physical therapy anti-inflammatories and reappoint to recheck in the next 2 weeks  X-ray indicates small spur with no indications of stress fracture arthritis

## 2018-08-13 ENCOUNTER — Ambulatory Visit (INDEPENDENT_AMBULATORY_CARE_PROVIDER_SITE_OTHER): Payer: BC Managed Care – PPO | Admitting: Podiatry

## 2018-08-13 ENCOUNTER — Encounter: Payer: Self-pay | Admitting: Podiatry

## 2018-08-13 ENCOUNTER — Other Ambulatory Visit: Payer: Self-pay

## 2018-08-13 DIAGNOSIS — M722 Plantar fascial fibromatosis: Secondary | ICD-10-CM

## 2018-08-13 DIAGNOSIS — M2042 Other hammer toe(s) (acquired), left foot: Secondary | ICD-10-CM

## 2018-08-13 DIAGNOSIS — M2041 Other hammer toe(s) (acquired), right foot: Secondary | ICD-10-CM

## 2018-08-14 NOTE — Progress Notes (Signed)
Subjective:   Patient ID: Alexandra Velasquez, female   DOB: 53 y.o.   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  She has moderate obesity and is a very tall woman with a lot of stress on her heels and on the right she does have discomfort in the second digit   ROS      Objective:  Physical Exam  Neurovascular status intact with exquisite discomfort left plantar fascia with discomfort in the mid heel with what appears to be palpable type tissue formation.  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  I did inject the left plantar fascial 3 mg Kenalog 5 mg Xylocaine and applied padding for the big toes

## 2018-08-27 ENCOUNTER — Other Ambulatory Visit: Payer: Self-pay

## 2018-08-27 ENCOUNTER — Ambulatory Visit (INDEPENDENT_AMBULATORY_CARE_PROVIDER_SITE_OTHER): Payer: BC Managed Care – PPO | Admitting: Orthotics

## 2018-08-27 ENCOUNTER — Encounter: Payer: Self-pay | Admitting: Podiatry

## 2018-08-27 DIAGNOSIS — M722 Plantar fascial fibromatosis: Secondary | ICD-10-CM

## 2018-08-27 DIAGNOSIS — M2041 Other hammer toe(s) (acquired), right foot: Secondary | ICD-10-CM

## 2018-08-27 DIAGNOSIS — M2042 Other hammer toe(s) (acquired), left foot: Secondary | ICD-10-CM

## 2018-08-27 NOTE — Progress Notes (Signed)

## 2018-09-17 ENCOUNTER — Other Ambulatory Visit: Payer: BC Managed Care – PPO | Admitting: Orthotics

## 2018-09-24 ENCOUNTER — Ambulatory Visit: Payer: BC Managed Care – PPO | Admitting: Orthotics

## 2018-09-24 ENCOUNTER — Encounter: Payer: Self-pay | Admitting: Podiatry

## 2018-09-24 ENCOUNTER — Other Ambulatory Visit: Payer: Self-pay

## 2018-09-24 DIAGNOSIS — M2042 Other hammer toe(s) (acquired), left foot: Secondary | ICD-10-CM

## 2018-09-24 DIAGNOSIS — M722 Plantar fascial fibromatosis: Secondary | ICD-10-CM

## 2018-09-24 DIAGNOSIS — M2041 Other hammer toe(s) (acquired), right foot: Secondary | ICD-10-CM

## 2018-09-24 NOTE — Progress Notes (Signed)
Patient came in today to pick up custom made foot orthotics.  The goals were accomplished and the patient reported no dissatisfaction with said orthotics.  Patient was advised of breakin period and how to report any issues. 

## 2018-10-01 ENCOUNTER — Other Ambulatory Visit: Payer: BC Managed Care – PPO | Admitting: Orthotics

## 2019-01-01 ENCOUNTER — Encounter: Payer: Self-pay | Admitting: Physical Medicine and Rehabilitation

## 2019-02-01 ENCOUNTER — Encounter: Payer: BC Managed Care – PPO | Admitting: Physical Medicine and Rehabilitation

## 2019-02-22 ENCOUNTER — Encounter: Payer: Self-pay | Admitting: Physical Medicine and Rehabilitation

## 2019-02-22 ENCOUNTER — Other Ambulatory Visit: Payer: Self-pay

## 2019-02-22 ENCOUNTER — Encounter
Payer: BC Managed Care – PPO | Attending: Physical Medicine and Rehabilitation | Admitting: Physical Medicine and Rehabilitation

## 2019-02-22 VITALS — BP 131/83 | HR 67 | Temp 97.3°F | Ht 70.5 in | Wt 268.0 lb

## 2019-02-22 DIAGNOSIS — M542 Cervicalgia: Secondary | ICD-10-CM | POA: Diagnosis present

## 2019-02-22 DIAGNOSIS — M797 Fibromyalgia: Secondary | ICD-10-CM | POA: Diagnosis present

## 2019-02-22 DIAGNOSIS — Z720 Tobacco use: Secondary | ICD-10-CM | POA: Insufficient documentation

## 2019-02-22 DIAGNOSIS — M7918 Myalgia, other site: Secondary | ICD-10-CM | POA: Insufficient documentation

## 2019-02-22 NOTE — Progress Notes (Signed)
Subjective:    Patient ID: Alexandra Velasquez, female    DOB: 08-Aug-1965, 54 y.o.   MRN: [id number]  HPI  Patient is a 54 yr old female with HTN and frequent headaches as well as fibromyalgia dx'd in 2013; weight gain in last few years from meds- Lyrica- here for neck pain evaluation.    Was told had bone spurs in neck.  Pain, stiffness, a lot of stiffness, can move a certain way, and hurts really bad. Cold and rainy weather makes it worse.  Sometimes it burning and stabbing; usually an ache and stiffness- feels like turtle in a shell that can't stretch out.   Has dx of Fibromyalgia since 2013. Tendinitis in R "arm" and L shoulder "bursitis" and L hip bursitis"  Per Ortho.  Having pain in legs as well In calves (stands on feet 6 days/week 10 hours/day) Wears [employer] and $400 shoe inserts that are working. 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   Pain Inventory Average Pain 7 Pain Right Now 7 My pain is sharp, burning, tingling and aching  In the last 24 hours, has pain interfered with the following? General activity 6 Relation with others 7 Enjoyment of life 7 What TIME of day is your pain at its worst? all Sleep (in general) Poor  Pain is worse with: walking, bending and standing Pain improves with: heat/ice Relief from Meds: na  Mobility walk without assistance do you drive?  yes  Function employed # of hrs/week 40  Neuro/Psych weakness numbness tingling spasms confusion  Prior  Studies Any changes since last visit?  no  Physicians involved in your care Any changes since last visit?  no   Family History  Problem Relation Age of Onset  . Gout Father   . Hypertension Father    Social History   Socioeconomic History  . Marital status: Married    Spouse name: Not on file  . Number of children: Not on file  . Years of education: Not on file  . Highest education level: Not on file  Occupational History  . Not on file  Tobacco Use  . Smoking status: Current Every Day Smoker    Packs/day: 0.75    Types: Cigarettes  . Smokeless tobacco: Never Used  Substance and Sexual Activity  . Alcohol use: No  . Drug use: No  . Sexual activity: Yes    Birth control/protection: None  Other Topics Concern  . Not on file  Social History Narrative  . Not on file   Social Determinants of Health   Financial Resource Strain:   . Difficulty of Paying Living Expenses: Not on file  Food Insecurity:   . Worried About Programme researcher, broadcasting/film/video in the Last Year: Not on file  . Ran Out of Food in the Last Year: Not on file  Transportation Needs:   . Lack of Transportation (Medical): Not on file  . Lack of Transportation (Non-Medical): Not on file  Physical  Activity:   . Days of Exercise per Week: Not on file  . Minutes of Exercise per Session: Not on file  Stress:   . Feeling of Stress : Not on file  Social Connections:   . Frequency of Communication with Friends and Family: Not on file  . Frequency of Social Gatherings with Friends and Family: Not on file  . Attends Religious Services: Not on file  . Active Member of Clubs or Organizations: Not on file  . Attends Archivist Meetings: Not on file  . Marital Status: Not on file   Past Surgical History:  Procedure Laterality Date  . BREAST SURGERY     biopsy benign tumor  . COLONOSCOPY N/A 04/20/2015   Procedure: COLONOSCOPY;  Surgeon: Wilford Corner, MD;  Location: [facility];  Service: Endoscopy;   Laterality: N/A;   Past Medical History:  Diagnosis Date  . Bursitis   . Chronic back pain   . Chronic neck pain   . COPD (chronic obstructive pulmonary disease) ([place])   . Diabetes mellitus without complication ([place])   . Fibromyalgia   . History of kidney stones 30 yrs ago  . Hypertension    BP 131/83   Pulse 67   Temp (!) 97.3 F (36.3 C)   Ht 5' 10.5" (1.791 m)   Wt 268 lb (121.6 kg)   SpO2 92%   BMI 37.91 kg/m   Opioid Risk Score:   Fall Risk Score:  `1  Depression screen PHQ 2/9  Depression screen PHQ 2/9 02/22/2019  Decreased Interest 1  Down, Depressed, Hopeless 0  PHQ - 2 Score 1  Altered sleeping 2  Tired, decreased energy 3  Change in appetite 1  Feeling bad or failure about yourself  0  Trouble concentrating 2  Moving slowly or fidgety/restless 0  Suicidal thoughts 0  PHQ-9 Score 9  Difficult doing work/chores Somewhat difficult   Review of Systems  Genitourinary:       Bladder control problems  Musculoskeletal:       Spasms  Neurological: Positive for weakness and numbness.       Tingling  Psychiatric/Behavioral: Positive for confusion.   An entire ROS was completed and found to be negative except for HPI and documented ROS    Objective:   Physical Exam  Awake, alert, appropriate, not accompanied, NAD MS: 5-/5 in UEs- deltoid, biceps, triceps, WE, grip and finger abd- decreased effort, likely due to pain  Neuro: intact sensation to light touch in UEs  Very tight L>R in scalenes at most, levators L>R, upper traps L>R, and rhomboids and pecs B/L        Assessment & Plan:    Patient is a 54 yr old with myofascial pain syndrome and fibromyalgia.    1. Dr Sharion Balloon- The survivor's Handbook to Fibromyalgia and Myofascial Pain Syndrome 2. Theracane- 2-4 minutes on each trigger point- hold firm pressure, don't massage; can get for $20-30 online- will never need replacing- youtube has great videos online 3. Tennis balls- 2-5 minutes on  each trigger point- buttocks, back of thighs, calves, low and mid back- can throw in dryer x1 to make softer. 4. Magnesium [phone] mg /day (400 mg 2-3xday)  for muscle tightness- titrate up until loose stools 5. Lidocaine patches- 2-3 patches 12 hrs on;12 hrs off- areas of most pain- never on bottom of feet- don't try until does trigger point injections- cover neck like cupping neck  Both sides 6. Rolling pin- can use on  calves, thighs and arms, and buttocks- roll slowly over muscles firmly- roll towards heart  Front and back of calves and thighs.  7. Want to makes sure thyroid TSH levels are 1-4- no higher than 4.  8. Showed pt trigger points in hands and feet to  also work on.  9. F/U in 1-2 weeks to do trigger point injections    I spent a total of 50 minutes on appointment and more than 30 minutes educating patient on dx as above and detailed as above

## 2019-02-22 NOTE — Patient Instructions (Signed)
Patient is a 54 yr old with myofascial pain syndrome and fibromyalgia.    1. Dr Nance Pew- The survivor's Handbook to Fibromyalgia and Myofascial Pain Syndrome 2. Theracane- 2-4 minutes on each trigger point- hold firm pressure, don't massage; can get for $20-30 online- will never need replacing- youtube has great videos online 3. Tennis balls- 2-5 minutes on each trigger point- buttocks, back of thighs, calves, low and mid back- can throw in dryer x1 to make softer. 4. Magnesium [phone] mg /day (400 mg 2-3xday)  for muscle tightness- titrate up until loose stools 5. Lidocaine patches- 2-3 patches 12 hrs on;12 hrs off- areas of most pain- never on bottom of feet- don't try until does trigger point injections- cover neck like cupping neck  Both sides 6. Rolling pin- can use on calves, thighs and arms, and buttocks- roll slowly over muscles firmly- roll towards heart  Front and back of calves and thighs.  7. Want to makes sure thyroid TSH levels are 1-4- no higher than 4.  8. Showed pt trigger points in hands and feet to  also work on.  9. F/U in 1-2 weeks to do trigger point injections

## 2019-03-12 ENCOUNTER — Other Ambulatory Visit: Payer: Self-pay

## 2019-03-12 ENCOUNTER — Encounter: Payer: BC Managed Care – PPO | Admitting: Physical Medicine and Rehabilitation

## 2019-03-12 ENCOUNTER — Encounter: Payer: Self-pay | Admitting: Physical Medicine and Rehabilitation

## 2019-03-12 VITALS — BP 129/87 | HR 84 | Temp 97.7°F | Ht 70.5 in | Wt 266.0 lb

## 2019-03-12 DIAGNOSIS — M7918 Myalgia, other site: Secondary | ICD-10-CM | POA: Diagnosis not present

## 2019-03-12 DIAGNOSIS — M797 Fibromyalgia: Secondary | ICD-10-CM

## 2019-03-12 DIAGNOSIS — Z720 Tobacco use: Secondary | ICD-10-CM

## 2019-03-12 MED ORDER — METAXALONE 800 MG PO TABS: 800.0000 mg | ORAL_TABLET | Freq: Two times a day (BID) | ORAL | 5 refills | Status: AC | PRN

## 2019-03-12 NOTE — Progress Notes (Signed)
     Pt feels like trying Magnesium 800 mg/day- 1 2x/day- haven't had had loose stools.   Tried tennis ball - felt good- helped some- (hasn't gotten rolling pin)   Patient here for trigger point injections for myofascial pain  Consent done and on chart.  Cleaned areas with alcohol and injected using a 27 gauge 1.5 inch needle  Injected  4cc Using 1% Lidocaine with no EPI  Upper traps B/L Levators B/L Posterior scalenes B/L Middle scalenes Splenius Capitus B/L Pectoralis Major B/L Rhomboids Infraspinatus Teres Major/minor Thoracic paraspinals Lumbar paraspinals Other injections-    Patient's level of pain prior was 7/10 Current level of pain after injections is 5/10- feels looser than was already; also burning in L shoulder gone  There was no bleeding or complications.  Patient was advised to drink a lot of water on day after injections to flush system Will have increased soreness for 12-48 hours after injections.  Can use Lidocaine patches the day AFTER injections Can use theracane on day of injections in places didn't inject Can use heating pad 4-6 hours AFTER injections  2. F/U in 4 weeks for trigger point injections.   3. Has fallen asleep with Flexeril and Robaxin made sleepy AND didn't work- so will try Skelaxin/Metaxalone- 400- 800 mg up to 2x/day as needed- take sparingly.   4. Will focus on these meds and wait on Cymbalta for this appointment.   5 Will do OTHER muscles at next visit because with fibromyalgia if inject more than 10 muscles, patients too sore.   I spent a total of 30 minutes on appointment; more than 15 minutes discussing options for pain control.

## 2019-03-12 NOTE — Patient Instructions (Signed)
  Injected  4cc Using 1% Lidocaine with no EPI  Upper traps B/L Levators B/L Posterior scalenes B/L Middle scalenes Splenius Capitus B/L Pectoralis Major B/L Rhomboids Infraspinatus Teres Major/minor Thoracic paraspinals Lumbar paraspinals Other injections-    Patient's level of pain prior was 7/10 Current level of pain after injections is 5/10- feels looser than was already; also burning in L shoulder gone  There was no bleeding or complications.  Patient was advised to drink a lot of water on day after injections to flush system Will have increased soreness for 12-48 hours after injections.  Can use Lidocaine patches the day AFTER injections Can use theracane on day of injections in places didn't inject Can use heating pad 4-6 hours AFTER injections  2. F/U in 4 weeks for trigger point injections.   3. Has fallen asleep with Flexeril and Robaxin made sleepy AND didn't work- so will try Skelaxin/Metaxalone- 400- 800 mg up to 2x/day as needed- take sparingly.   4. Will focus on these meds and wait on Cymbalta for this appointment.   5 Will do OTHER muscles at next visit because with fibromyalgia if inject more than 10 muscles, patients too sore.

## 2019-04-09 ENCOUNTER — Encounter: Payer: Self-pay | Admitting: Physical Medicine and Rehabilitation

## 2019-04-09 ENCOUNTER — Other Ambulatory Visit: Payer: Self-pay

## 2019-04-09 ENCOUNTER — Encounter
Payer: BC Managed Care – PPO | Attending: Physical Medicine and Rehabilitation | Admitting: Physical Medicine and Rehabilitation

## 2019-04-09 VITALS — BP 100/67 | HR 88 | Temp 97.7°F | Ht 70.5 in | Wt 259.0 lb

## 2019-04-09 DIAGNOSIS — Z720 Tobacco use: Secondary | ICD-10-CM | POA: Diagnosis present

## 2019-04-09 DIAGNOSIS — M7918 Myalgia, other site: Secondary | ICD-10-CM

## 2019-04-09 DIAGNOSIS — M542 Cervicalgia: Secondary | ICD-10-CM | POA: Insufficient documentation

## 2019-04-09 DIAGNOSIS — M797 Fibromyalgia: Secondary | ICD-10-CM | POA: Insufficient documentation

## 2019-04-09 NOTE — Patient Instructions (Signed)
1. Trigger points injections Patient here for trigger point injections for  Consent done and on chart.  Cleaned areas with alcohol and injected using a 27 gauge 1.5 inch needle  Injected 4.5cc Using 1% Lidocaine with no EPI  Upper traps B/L Levators B/L Posterior scalenes B/L Middle scalenes Splenius Capitus Pectoralis Major B/L Rhomboids B/L Infraspinatus Teres Major/minor Thoracic paraspinals B/L Lumbar paraspinals Other injections-    Patient's level of pain prior was 6-8/10 Current level of pain after injections is- now 5/10- not bad at all; a little pain.   There was no bleeding or complications.  Patient was advised to drink a lot of water on day after injections to flush system Will have increased soreness for 12-48 hours after injections.  Can use Lidocaine patches the day AFTER injections Can use theracane on day of injections in places didn't inject Can use heating pad 4-6 hours AFTER injections       2. Showed pt strain-counterstrain exercises- so push against resistance.   3. F/U in 8 weeks

## 2019-04-09 NOTE — Progress Notes (Signed)
  Things going well  Working 6 days/week 10 hrs/day  Neck isn't as tight as when saw me last time.  Taking tomorrow off- since was so sore after last set.   Can now shrug and looks down all day long- takes breaks to look up.      A/P  Patient is a 54 yr old with myofascial pain syndrome and fibromyalgia.   1. Trigger points injections Patient here for trigger point injections for  Consent done and on chart.  Cleaned areas with alcohol and injected using a 27 gauge 1.5 inch needle  Injected 4.5cc Using 1% Lidocaine with no EPI  Upper traps B/L Levators B/L Posterior scalenes B/L Middle scalenes Splenius Capitus Pectoralis Major B/L Rhomboids B/L Infraspinatus Teres Major/minor Thoracic paraspinals B/L Lumbar paraspinals Other injections-    Patient's level of pain prior was 6-8/10 Current level of pain after injections is- now 5/10- not bad at all; a little pain.   There was no bleeding or complications.  Patient was advised to drink a lot of water on day after injections to flush system Will have increased soreness for 12-48 hours after injections.  Can use Lidocaine patches the day AFTER injections Can use theracane on day of injections in places didn't inject Can use heating pad 4-6 hours AFTER injections       2. Showed pt strain-counterstrain exercises- so push against resistance.   3. F/U in 8 weeks

## 2019-05-13 ENCOUNTER — Other Ambulatory Visit: Payer: Self-pay | Admitting: Family Medicine

## 2019-05-13 DIAGNOSIS — Z1231 Encounter for screening mammogram for malignant neoplasm of breast: Secondary | ICD-10-CM

## 2019-06-04 ENCOUNTER — Ambulatory Visit: Payer: BC Managed Care – PPO | Admitting: Physical Medicine and Rehabilitation

## 2019-06-11 ENCOUNTER — Encounter
Payer: BC Managed Care – PPO | Attending: Physical Medicine and Rehabilitation | Admitting: Physical Medicine and Rehabilitation

## 2019-06-11 DIAGNOSIS — M7918 Myalgia, other site: Secondary | ICD-10-CM | POA: Insufficient documentation

## 2019-06-11 DIAGNOSIS — Z720 Tobacco use: Secondary | ICD-10-CM | POA: Insufficient documentation

## 2019-06-11 DIAGNOSIS — M542 Cervicalgia: Secondary | ICD-10-CM | POA: Insufficient documentation

## 2019-06-11 DIAGNOSIS — M797 Fibromyalgia: Secondary | ICD-10-CM | POA: Insufficient documentation

## 2020-02-02 ENCOUNTER — Ambulatory Visit: Payer: BC Managed Care – PPO

## 2020-02-23 ENCOUNTER — Ambulatory Visit
Admission: RE | Admit: 2020-02-23 | Discharge: 2020-02-23 | Disposition: A | Discharge status: Home / Self Care | Payer: BC Managed Care – PPO | Source: Ambulatory Visit | Attending: Family Medicine | Admitting: Family Medicine

## 2020-02-23 ENCOUNTER — Other Ambulatory Visit: Payer: Self-pay

## 2020-02-23 DIAGNOSIS — Z1231 Encounter for screening mammogram for malignant neoplasm of breast: Secondary | ICD-10-CM

## 2021-11-11 IMAGING — MG DIGITAL SCREENING BILAT W/ CAD
6 series · 6 of 6 positions shown · non-contrast
Comparison: Previous exam(s).

ACR Breast Density Category a: The breast tissue is almost entirely
fatty.

CLINICAL DATA: Screening.

EXAM:
DIGITAL SCREENING BILATERAL MAMMOGRAM WITH CAD

[R CC (1 of 2)]
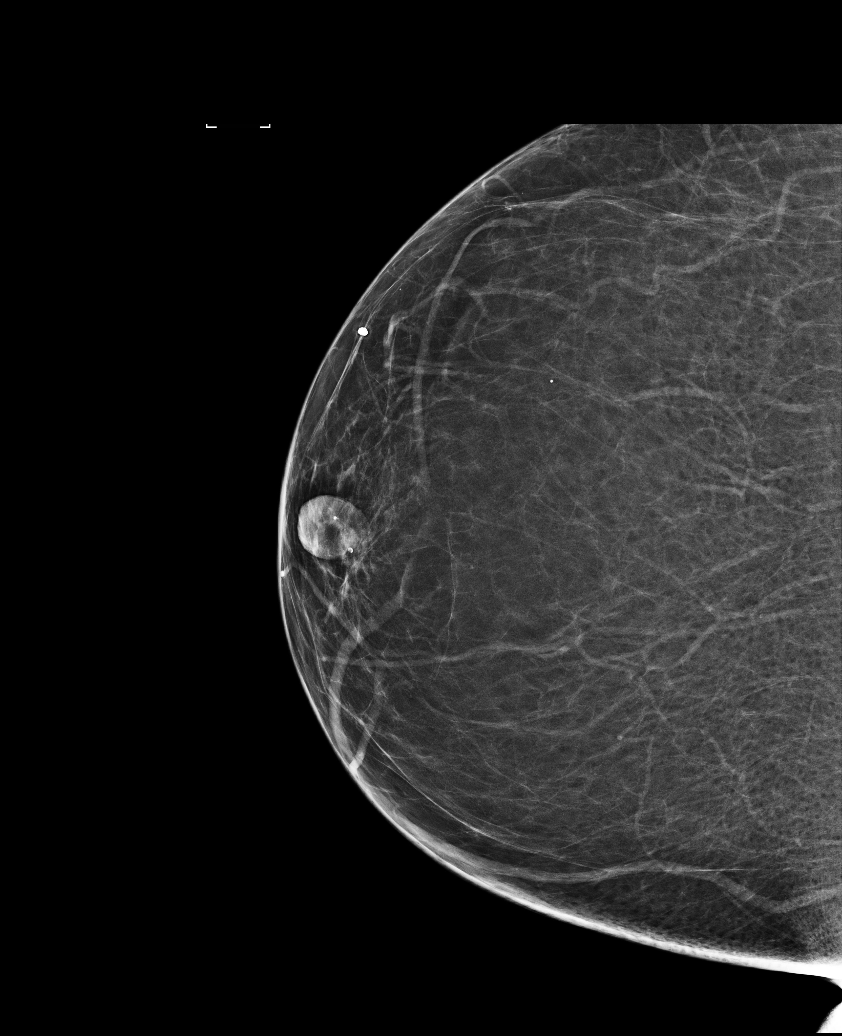

[R MLO]
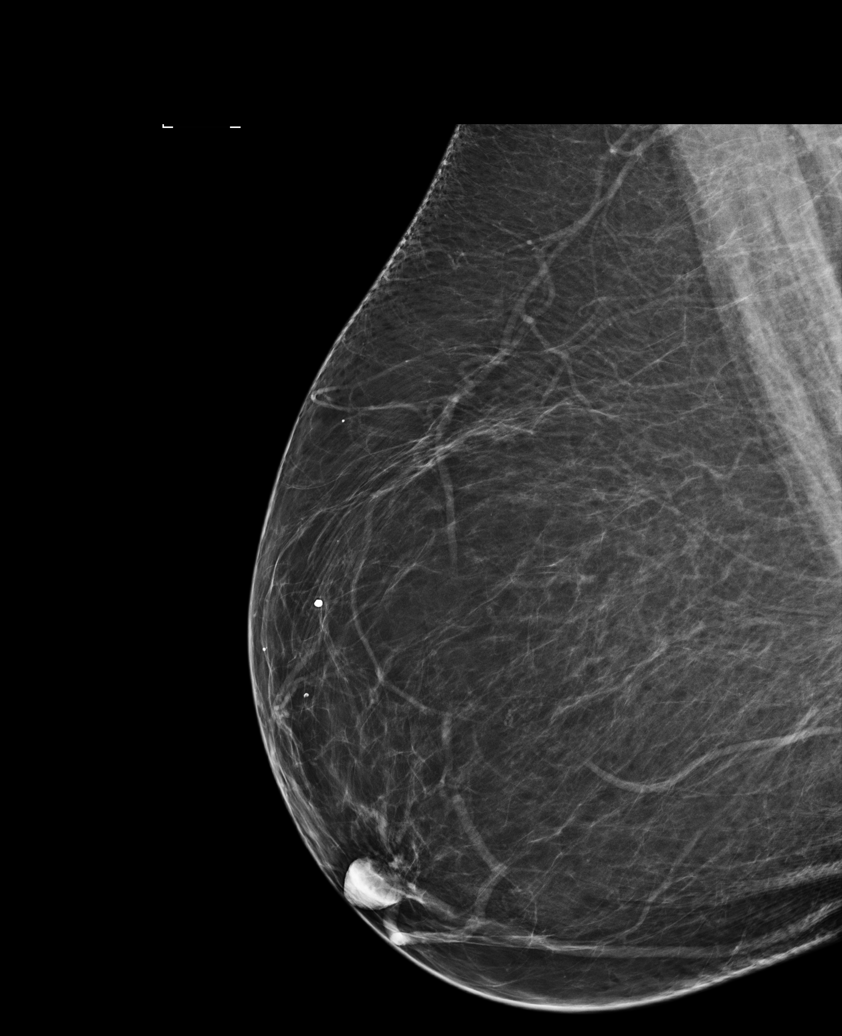

[R CC (2 of 2)]
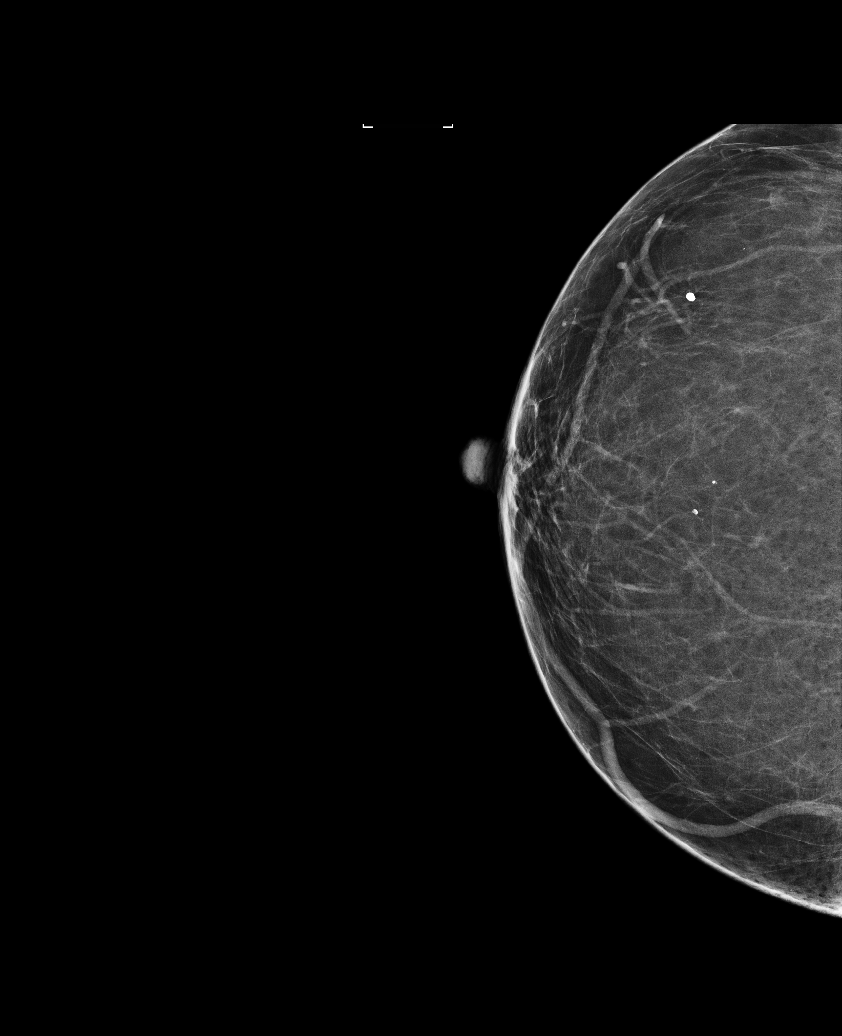

[L MLO]
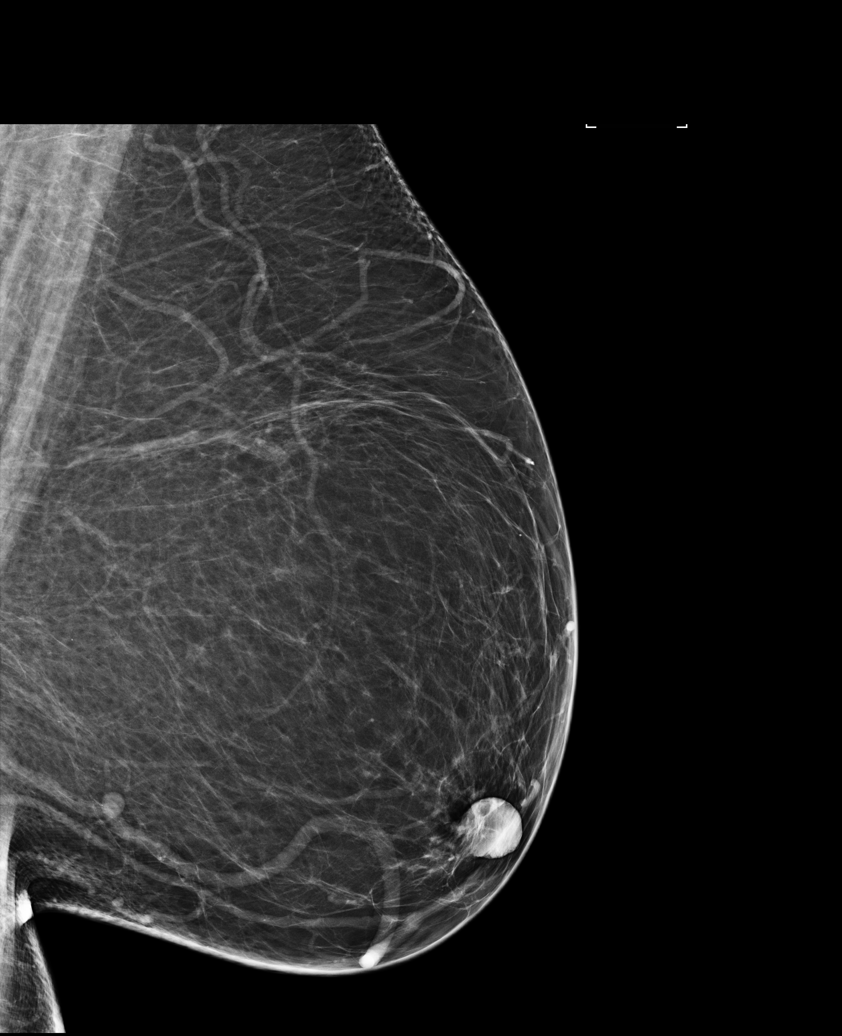

[L CC (1 of 2)]
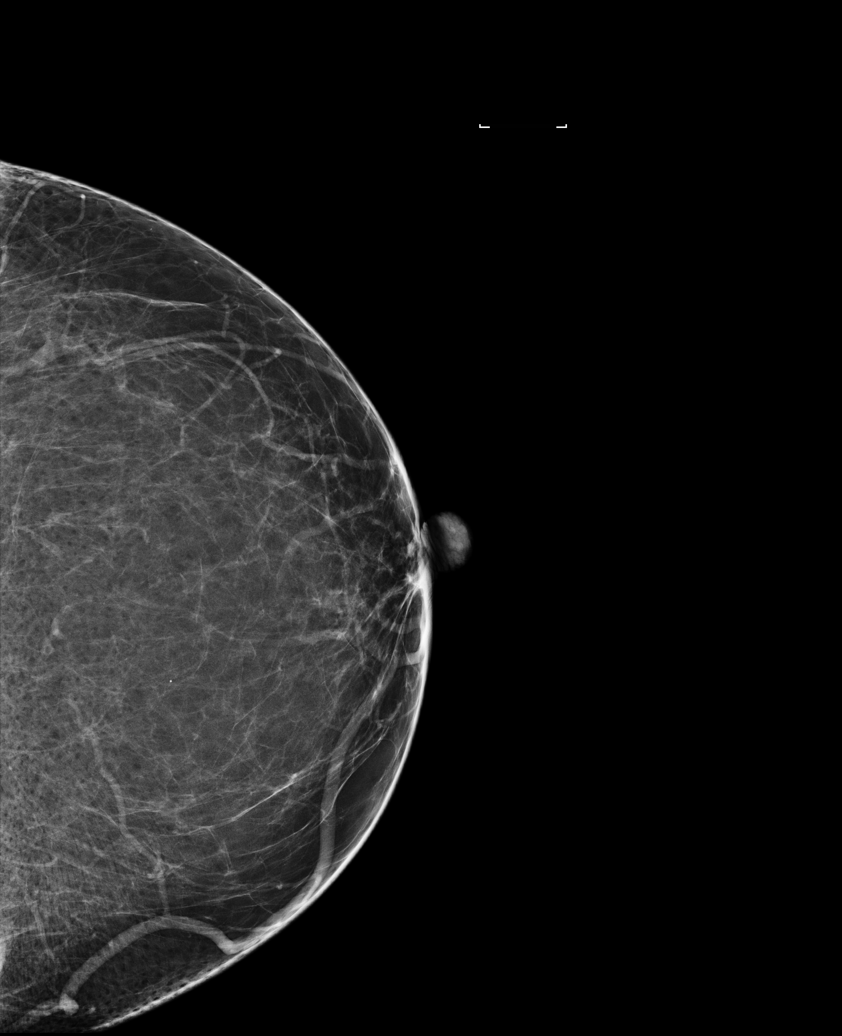

[L CC (2 of 2)]
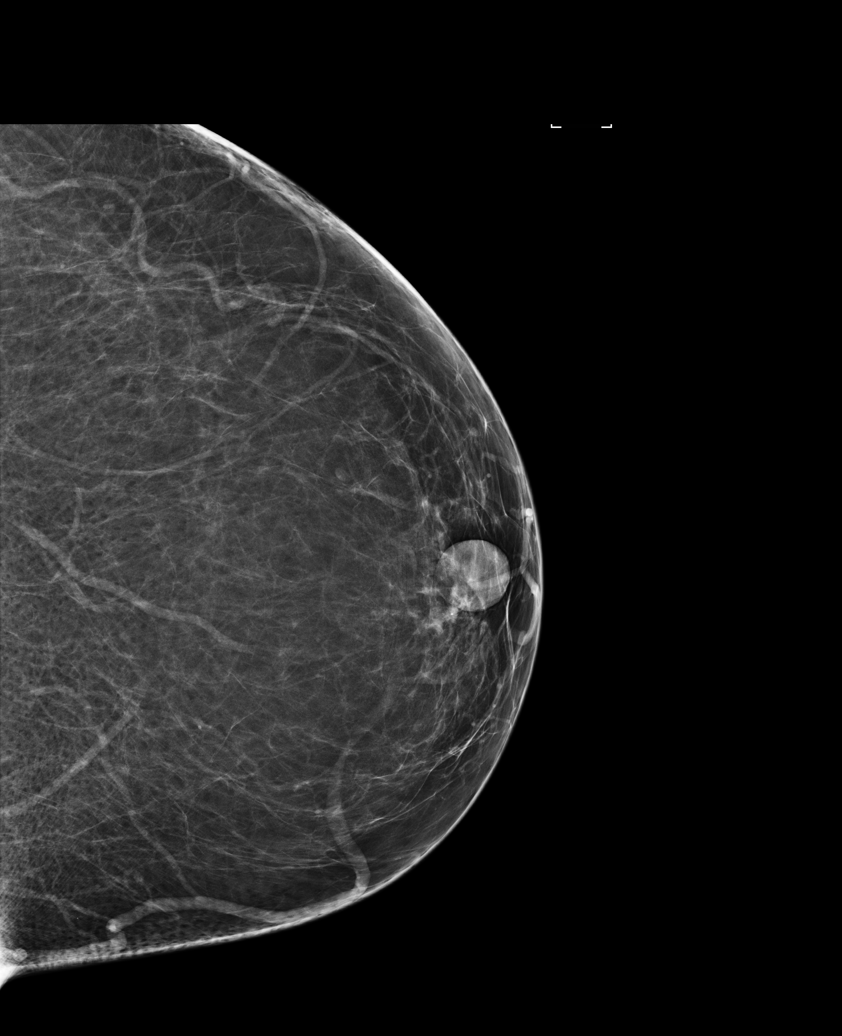

[6 of 6 positions shown; findings below may reference images not displayed]

FINDINGS: There are no findings suspicious for malignancy. The images were
evaluated with computer-aided detection.
IMPRESSION: No mammographic evidence of malignancy. A result letter of this
screening mammogram will be mailed directly to the patient.

RECOMMENDATION:
Screening mammogram in one year. (Code:PW-[id number])

BI-RADS CATEGORY  1: Negative.
